# Patient Record
Sex: Male | Born: 1950 | Race: White | Hispanic: No | Marital: Single | Smoking: Former smoker
Health system: Southern US, Community
[De-identification: ages and names within clinical notes are randomized; demographics above are authoritative.]

---

## 2013-10-20 DIAGNOSIS — N401 Enlarged prostate with lower urinary tract symptoms: Secondary | ICD-10-CM

## 2013-10-20 DIAGNOSIS — G8929 Other chronic pain: Secondary | ICD-10-CM

## 2013-10-20 DIAGNOSIS — M545 Low back pain, unspecified: Secondary | ICD-10-CM

## 2013-10-20 HISTORY — DX: Low back pain, unspecified: M54.50

## 2013-10-20 HISTORY — DX: Other chronic pain: G89.29

## 2013-10-20 HISTORY — DX: Benign prostatic hyperplasia with lower urinary tract symptoms: N40.1

## 2014-01-24 DIAGNOSIS — K219 Gastro-esophageal reflux disease without esophagitis: Secondary | ICD-10-CM

## 2014-01-24 DIAGNOSIS — J449 Chronic obstructive pulmonary disease, unspecified: Secondary | ICD-10-CM

## 2014-01-24 DIAGNOSIS — I5022 Chronic systolic (congestive) heart failure: Secondary | ICD-10-CM

## 2014-01-24 DIAGNOSIS — I10 Essential (primary) hypertension: Secondary | ICD-10-CM | POA: Insufficient documentation

## 2014-01-24 HISTORY — DX: Chronic obstructive pulmonary disease, unspecified: J44.9

## 2014-01-24 HISTORY — DX: Gastro-esophageal reflux disease without esophagitis: K21.9

## 2014-01-24 HISTORY — DX: Chronic systolic (congestive) heart failure: I50.22

## 2014-01-24 HISTORY — DX: Essential (primary) hypertension: I10

## 2015-08-28 DIAGNOSIS — I2510 Atherosclerotic heart disease of native coronary artery without angina pectoris: Secondary | ICD-10-CM

## 2015-08-28 HISTORY — DX: Atherosclerotic heart disease of native coronary artery without angina pectoris: I25.10

## 2017-05-05 ENCOUNTER — Other Ambulatory Visit: Payer: Self-pay

## 2017-06-30 ENCOUNTER — Other Ambulatory Visit: Payer: Self-pay | Admitting: Cardiology

## 2017-10-14 DIAGNOSIS — I4729 Other ventricular tachycardia: Secondary | ICD-10-CM

## 2017-10-14 HISTORY — DX: Other ventricular tachycardia: I47.29

## 2018-01-28 DIAGNOSIS — Z95810 Presence of automatic (implantable) cardiac defibrillator: Secondary | ICD-10-CM

## 2018-01-28 HISTORY — DX: Presence of automatic (implantable) cardiac defibrillator: Z95.810

## 2020-09-18 DIAGNOSIS — Z79891 Long term (current) use of opiate analgesic: Secondary | ICD-10-CM

## 2020-09-18 HISTORY — DX: Long term (current) use of opiate analgesic: Z79.891

## 2022-03-29 ENCOUNTER — Emergency Department (HOSPITAL_BASED_OUTPATIENT_CLINIC_OR_DEPARTMENT_OTHER): Payer: Medicare HMO

## 2022-03-29 ENCOUNTER — Ambulatory Visit: Payer: Self-pay | Admitting: *Deleted

## 2022-03-29 ENCOUNTER — Inpatient Hospital Stay (HOSPITAL_BASED_OUTPATIENT_CLINIC_OR_DEPARTMENT_OTHER)
Admission: EM | Admit: 2022-03-29 | Discharge: 2022-04-03 | DRG: 641 | Disposition: A | Discharge status: Home Health | Payer: Medicare HMO | Attending: Internal Medicine | Admitting: Internal Medicine

## 2022-03-29 DIAGNOSIS — I110 Hypertensive heart disease with heart failure: Secondary | ICD-10-CM | POA: Diagnosis not present

## 2022-03-29 DIAGNOSIS — Z91148 Patient's other noncompliance with medication regimen for other reason: Secondary | ICD-10-CM

## 2022-03-29 DIAGNOSIS — Z91041 Radiographic dye allergy status: Secondary | ICD-10-CM

## 2022-03-29 DIAGNOSIS — R296 Repeated falls: Secondary | ICD-10-CM | POA: Diagnosis not present

## 2022-03-29 DIAGNOSIS — E860 Dehydration: Secondary | ICD-10-CM | POA: Diagnosis present

## 2022-03-29 DIAGNOSIS — Z87891 Personal history of nicotine dependence: Secondary | ICD-10-CM

## 2022-03-29 DIAGNOSIS — M545 Low back pain, unspecified: Secondary | ICD-10-CM | POA: Diagnosis not present

## 2022-03-29 DIAGNOSIS — Z882 Allergy status to sulfonamides status: Secondary | ICD-10-CM | POA: Diagnosis not present

## 2022-03-29 DIAGNOSIS — N400 Enlarged prostate without lower urinary tract symptoms: Secondary | ICD-10-CM | POA: Diagnosis present

## 2022-03-29 DIAGNOSIS — Z765 Malingerer [conscious simulation]: Secondary | ICD-10-CM | POA: Diagnosis not present

## 2022-03-29 DIAGNOSIS — J449 Chronic obstructive pulmonary disease, unspecified: Secondary | ICD-10-CM | POA: Diagnosis present

## 2022-03-29 DIAGNOSIS — Z91030 Bee allergy status: Secondary | ICD-10-CM | POA: Diagnosis not present

## 2022-03-29 DIAGNOSIS — Z7982 Long term (current) use of aspirin: Secondary | ICD-10-CM

## 2022-03-29 DIAGNOSIS — F1310 Sedative, hypnotic or anxiolytic abuse, uncomplicated: Secondary | ICD-10-CM | POA: Diagnosis present

## 2022-03-29 DIAGNOSIS — I2510 Atherosclerotic heart disease of native coronary artery without angina pectoris: Secondary | ICD-10-CM | POA: Diagnosis present

## 2022-03-29 DIAGNOSIS — Z9981 Dependence on supplemental oxygen: Secondary | ICD-10-CM

## 2022-03-29 DIAGNOSIS — G8929 Other chronic pain: Secondary | ICD-10-CM | POA: Diagnosis not present

## 2022-03-29 DIAGNOSIS — F1010 Alcohol abuse, uncomplicated: Secondary | ICD-10-CM | POA: Diagnosis not present

## 2022-03-29 DIAGNOSIS — Z79891 Long term (current) use of opiate analgesic: Secondary | ICD-10-CM | POA: Diagnosis not present

## 2022-03-29 DIAGNOSIS — Z79899 Other long term (current) drug therapy: Secondary | ICD-10-CM

## 2022-03-29 DIAGNOSIS — E876 Hypokalemia: Secondary | ICD-10-CM | POA: Diagnosis present

## 2022-03-29 DIAGNOSIS — K219 Gastro-esophageal reflux disease without esophagitis: Secondary | ICD-10-CM | POA: Diagnosis not present

## 2022-03-29 DIAGNOSIS — Z888 Allergy status to other drugs, medicaments and biological substances status: Secondary | ICD-10-CM

## 2022-03-29 DIAGNOSIS — F1110 Opioid abuse, uncomplicated: Secondary | ICD-10-CM | POA: Diagnosis present

## 2022-03-29 DIAGNOSIS — I472 Ventricular tachycardia, unspecified: Secondary | ICD-10-CM | POA: Diagnosis present

## 2022-03-29 DIAGNOSIS — Z95810 Presence of automatic (implantable) cardiac defibrillator: Secondary | ICD-10-CM | POA: Diagnosis not present

## 2022-03-29 DIAGNOSIS — Z7951 Long term (current) use of inhaled steroids: Secondary | ICD-10-CM

## 2022-03-29 DIAGNOSIS — R531 Weakness: Principal | ICD-10-CM

## 2022-03-29 DIAGNOSIS — I509 Heart failure, unspecified: Secondary | ICD-10-CM

## 2022-03-29 DIAGNOSIS — I5022 Chronic systolic (congestive) heart failure: Secondary | ICD-10-CM | POA: Diagnosis present

## 2022-03-29 DIAGNOSIS — I951 Orthostatic hypotension: Secondary | ICD-10-CM | POA: Diagnosis not present

## 2022-03-29 DIAGNOSIS — I959 Hypotension, unspecified: Secondary | ICD-10-CM

## 2022-03-29 DIAGNOSIS — J9611 Chronic respiratory failure with hypoxia: Secondary | ICD-10-CM | POA: Diagnosis not present

## 2022-03-29 LAB — URINALYSIS, ROUTINE W REFLEX MICROSCOPIC
Bilirubin Urine: NEGATIVE
Glucose, UA: NEGATIVE mg/dL
Ketones, ur: NEGATIVE mg/dL
Leukocytes,Ua: NEGATIVE
Nitrite: NEGATIVE
Protein, ur: NEGATIVE mg/dL
Specific Gravity, Urine: 1.01 (ref 1.005–1.030)
pH: 6 (ref 5.0–8.0)

## 2022-03-29 LAB — COMPREHENSIVE METABOLIC PANEL
ALT: 25 U/L (ref 0–44)
AST: 26 U/L (ref 15–41)
Albumin: 3.3 g/dL — ABNORMAL LOW (ref 3.5–5.0)
Alkaline Phosphatase: 75 U/L (ref 38–126)
Anion gap: 8 (ref 5–15)
BUN: 16 mg/dL (ref 8–23)
CO2: 27 mmol/L (ref 22–32)
Calcium: 8.5 mg/dL — ABNORMAL LOW (ref 8.9–10.3)
Chloride: 104 mmol/L (ref 98–111)
Creatinine, Ser: 1.49 mg/dL — ABNORMAL HIGH (ref 0.61–1.24)
GFR, Estimated: 50 mL/min — ABNORMAL LOW (ref >60)
Glucose, Bld: 122 mg/dL — ABNORMAL HIGH (ref 70–99)
Potassium: 3 mmol/L — ABNORMAL LOW (ref 3.5–5.1)
Sodium: 139 mmol/L (ref 135–145)
Total Bilirubin: 0.8 mg/dL (ref 0.3–1.2)
Total Protein: 6.2 g/dL — ABNORMAL LOW (ref 6.5–8.1)

## 2022-03-29 LAB — CBC
HCT: 46.4 % (ref 39.0–52.0)
Hemoglobin: 14.8 g/dL (ref 13.0–17.0)
MCH: 27.6 pg (ref 26.0–34.0)
MCHC: 31.9 g/dL (ref 30.0–36.0)
MCV: 86.4 fL (ref 80.0–100.0)
Platelets: 149 10*3/uL — ABNORMAL LOW (ref 150–400)
RBC: 5.37 MIL/uL (ref 4.22–5.81)
RDW: 20 % — ABNORMAL HIGH (ref 11.5–15.5)
WBC: 6.2 10*3/uL (ref 4.0–10.5)
nRBC: 0.5 % — ABNORMAL HIGH (ref 0.0–0.2)

## 2022-03-29 LAB — DIFFERENTIAL
Abs Immature Granulocytes: 0.03 10*3/uL (ref 0.00–0.07)
Basophils Absolute: 0 10*3/uL (ref 0.0–0.1)
Basophils Relative: 0 %
Eosinophils Absolute: 0.1 10*3/uL (ref 0.0–0.5)
Eosinophils Relative: 1 %
Immature Granulocytes: 1 %
Lymphocytes Relative: 16 %
Lymphs Abs: 1 10*3/uL (ref 0.7–4.0)
Monocytes Absolute: 0.7 10*3/uL (ref 0.1–1.0)
Monocytes Relative: 11 %
Neutro Abs: 4.4 10*3/uL (ref 1.7–7.7)
Neutrophils Relative %: 71 %

## 2022-03-29 LAB — BRAIN NATRIURETIC PEPTIDE: B Natriuretic Peptide: 200.9 pg/mL — ABNORMAL HIGH (ref 0.0–100.0)

## 2022-03-29 LAB — I-STAT VENOUS BLOOD GAS, ED
Acid-Base Excess: 3 mmol/L — ABNORMAL HIGH (ref 0.0–2.0)
Bicarbonate: 28.2 mmol/L — ABNORMAL HIGH (ref 20.0–28.0)
Calcium, Ion: 1.12 mmol/L — ABNORMAL LOW (ref 1.15–1.40)
HCT: 41 % (ref 39.0–52.0)
Hemoglobin: 13.9 g/dL (ref 13.0–17.0)
O2 Saturation: 86 %
Patient temperature: 98.8
Potassium: 2.9 mmol/L — ABNORMAL LOW (ref 3.5–5.1)
Sodium: 140 mmol/L (ref 135–145)
TCO2: 30 mmol/L (ref 22–32)
pCO2, Ven: 43.5 mmHg — ABNORMAL LOW (ref 44–60)
pH, Ven: 7.421 (ref 7.25–7.43)
pO2, Ven: 52 mmHg — ABNORMAL HIGH (ref 32–45)

## 2022-03-29 LAB — TROPONIN I (HIGH SENSITIVITY): Troponin I (High Sensitivity): 12 ng/L (ref <18)

## 2022-03-29 LAB — URINALYSIS, MICROSCOPIC (REFLEX): WBC, UA: NONE SEEN WBC/hpf (ref 0–5)

## 2022-03-29 LAB — PROTIME-INR
INR: 1.1 (ref 0.8–1.2)
Prothrombin Time: 14.1 seconds (ref 11.4–15.2)

## 2022-03-29 LAB — AMMONIA: Ammonia: 34 umol/L (ref 9–35)

## 2022-03-29 LAB — CBG MONITORING, ED: Glucose-Capillary: 149 mg/dL — ABNORMAL HIGH (ref 70–99)

## 2022-03-29 LAB — APTT: aPTT: 28 seconds (ref 24–36)

## 2022-03-29 LAB — ETHANOL: Alcohol, Ethyl (B): <10 mg/dL (ref <10)

## 2022-03-29 MED ORDER — LACTATED RINGERS IV BOLUS
500.0000 mL | Freq: Once | INTRAVENOUS | Status: AC
  Administered 2022-03-29: 500 mL via INTRAVENOUS

## 2022-03-29 NOTE — Telephone Encounter (Signed)
  Chief Complaint: Falls Symptoms:  Frequency:  Pertinent Negatives: Patient denies  Disposition: [x] ED /[] Urgent Care (no appt availability in office) / [] Appointment(In office/virtual)/ []  [facility]/ [] Home Care/ [] Refused Recommended Disposition /[] [facility]/ []  Follow-up with PCP Additional Notes: Triage incomplete as pt falling again during call as he attempted to get up. Advised EMS. Sister states will do so. Reason for Disposition  [1] SEVERE weakness (i.e., unable to walk or barely able to walk, requires support) AND [2] new-onset or worsening  Answer Assessment - Initial Assessment Questions 1. MECHANISM: "How did the fall happen?"     Weakness 2. DOMESTIC VIOLENCE AND ELDER ABUSE SCREENING: "Did you fall because someone pushed you or tried to hurt you?" If Yes, ask: "Are you safe now?"     *No Answer* 3. ONSET: "When did the fall happen?" (e.g., minutes, hours, or days ago)     *No Answer* 4. LOCATION: "What part of the body hit the ground?" (e.g., back, buttocks, head, hips, knees, hands, head, stomach)     *No Answer* 5. INJURY: "Did you hurt (injure) yourself when you fell?" If Yes, ask: "What did you injure? Tell me more about this?" (e.g., body area; type of injury; pain severity)"     *No Answer* 6. PAIN: "Is there any pain?" If Yes, ask: "How bad is the pain?" (e.g., Scale 1-10; or mild,  moderate, severe)   - NONE (0): No pain   - MILD (1-3): Doesn't interfere with normal activities    - MODERATE (4-7): Interferes with normal activities or awakens from sleep    - SEVERE (8-10): Excruciating pain, unable to do any normal activities      *No Answer* 7. SIZE: For cuts, bruises, or swelling, ask: "How large is it?" (e.g., inches or centimeters)      *No Answer* 8. PREGNANCY: "Is there any chance you are pregnant?" "When was your last menstrual period?"     *No Answer* 9. OTHER SYMPTOMS: "Do you have any other symptoms?" (e.g., dizziness, fever,  weakness; new onset or worsening).      *No Answer* 10. CAUSE: "What do you think caused the fall (or falling)?" (e.g., tripped, dizzy spell)       *No Answer*  Protocols used: Falls and [facility]

## 2022-03-29 NOTE — ED Triage Notes (Signed)
Pt has been falling more frequently. Sister states she spoke with him on the phone 3 days ago and noticed his speech was slurred. Sister states he has had an unsteady gait, fell 3 times last night.   Larey Seat and hit his head on the end of the bed 3 nights ago.

## 2022-03-29 NOTE — ED Provider Notes (Signed)
[facility] Provider Note   CSN: [id number] Arrival date & time: 03/29/22  1845     History  Chief Complaint  Patient presents with   Aphasia   Fall    Ryan Rodgers is a 71 y.o. male.  Patient is a 71 year old male with a significant past medical history for CAD, CHF with most recent echo showing an EF of 20 to 25%, recent hospitalizations for pneumonia and sepsis a little over a month ago who is presenting today with his sister due to generalized weakness, recurrent falling and slurred speech.  Patient sister reports that he was staying with her after his last hospitalization but has been home for the last 4 weeks.  He has been doing pretty well getting around with his walker but in the last week he has suddenly started to have generalized weakness.  He initially was telling her he was fine however his neighbor heard him fall and he then admitted in the last few days he has had multiple falls because he is so weak he cannot even stand.  He has hit his head a few times during the falls but denies loss of consciousness.  He denies any chest pain or shortness of breath at this time.  When looking through cardiology notes it reports that patient's weight was up however when talking with the patient and his sister they report he is down 6 pounds.  Patient denies any nausea vomiting or diarrhea.  He denies any abdominal pain.  He reports he generally just feels unwell.  He does report he has had some new medications but is not sure what they are.  He has tried to be compliant with the medications.  However when looking at cardiology notes he was taking spironolactone as he was just continued on his Entresto due to fluid down and syncope.  He had also been on Lasix but reports that he was taking some Aldactone.  He does report urinating but generally feels weak.  Sister reports when she talk to him on Tuesday he seemed to have some slurred speech.  Is unclear how frequently  patient is taking Xanax he was given for anxiety by his PCP or when he last took it.  However his sister reports today when they got there he was so weak he could not get up.  It took her husband and a neighbor to pick him up and get him in the car but he was unable to walk independently.  The history is provided by the patient, a relative and medical records.  Fall       Home Medications Prior to Admission medications   Medication Sig Start Date End Date Taking? Authorizing Provider  aspirin EC 81 MG tablet Take by mouth. 06/07/20  Yes [provider]  ALPRAZolam Duanne Moron) 1 MG tablet Take 1 mg by mouth 2 (two) times daily as needed. 03/23/22   [provider]  BREO ELLIPTA 100-25 MCG/ACT AEPB Inhale 1 puff into the lungs daily. 03/20/22   [provider]  cefdinir (OMNICEF) 300 MG capsule Take 300 mg by mouth 2 (two) times daily. 02/08/22   [provider]  diclofenac Sodium (VOLTAREN) 1 % GEL Apply topically. 03/19/22   [provider]  ENTRESTO 24-26 MG Take 1 tablet by mouth 2 (two) times daily. 02/27/22   [provider]  finasteride (PROSCAR) 5 MG tablet Take 5 mg by mouth daily. 01/08/22   [provider]  HYDROcodone-acetaminophen (NORCO/VICODIN) 5-325  MG tablet Take 1 tablet by mouth 3 (three) times daily as needed. 03/20/22   [provider]  metoprolol succinate (TOPROL-XL) 25 MG 24 hr tablet Take 25 mg by mouth 2 (two) times daily. 01/08/22   [provider]  mexiletine (MEXITIL) 150 MG capsule Take by mouth. 03/05/22   [provider]  omeprazole (PRILOSEC) 40 MG capsule Take 40 mg by mouth daily. 03/05/22   [provider]  ondansetron (ZOFRAN-ODT) 4 MG disintegrating tablet Take by mouth. 02/08/22   [provider]  potassium chloride SA (KLOR-CON M) 20 MEQ tablet Take 20 mEq by mouth 2 (two) times daily. 01/08/22   [provider]  predniSONE (DELTASONE) 10 MG tablet Take by  mouth. 02/08/22   [provider]  rosuvastatin (CRESTOR) 20 MG tablet Take 20 mg by mouth at bedtime. 01/08/22   [provider]  spironolactone (ALDACTONE) 25 MG tablet Take 12.5 mg by mouth daily. 03/25/22   [provider]  tamsulosin (FLOMAX) 0.4 MG CAPS capsule Take 0.8 mg by mouth daily. 01/08/22   [provider]  venlafaxine XR (EFFEXOR-XR) 150 MG 24 hr capsule Take 150 mg by mouth daily. 01/09/22   [provider]      Allergies    Bee venom, Buspirone, Iodinated contrast media, and Sulfa antibiotics    Review of Systems   Review of Systems  Physical Exam Updated Vital Signs BP (!) 92/52   Pulse 88   Temp 98.4 F (36.9 C) (Oral)   Resp (!) 21   Ht 5\' 8"  (1.727 m)   Wt 75.4 kg   SpO2 93%   BMI 25.27 kg/m  Physical Exam Vitals and nursing note reviewed.  Constitutional:      General: He is not in acute distress.    Appearance: He is well-developed.     Comments: Patient is sleepy but is able to answer questions appropriately.  HENT:     Head: Normocephalic and atraumatic.  Eyes:     Conjunctiva/sclera: Conjunctivae normal.     Pupils: Pupils are equal, round, and reactive to light.  Cardiovascular:     Rate and Rhythm: Normal rate and regular rhythm.     Heart sounds: No murmur heard. Pulmonary:     Effort: Pulmonary effort is normal. No respiratory distress.     Breath sounds: Normal breath sounds. No wheezing or rales.  Abdominal:     General: There is no distension.     Palpations: Abdomen is soft.     Tenderness: There is no abdominal tenderness. There is no guarding or rebound.  Musculoskeletal:        General: No tenderness. Normal range of motion.     Cervical back: Normal range of motion and neck supple.     Right lower leg: No edema.     Left lower leg: No edema.  Skin:    General: Skin is warm and dry.     Findings: No erythema or rash.  Neurological:     Comments: Oriented to person and place.  Speech is  slowed but no significant slurred speech.  No notable facial droop.  Strength is intact in all 4 extremities without pronator drift but 4 out of 5 throughout.  Sensation is intact.  No nystagmus and extraocular movements are normal.  Pupils are reactive bilaterally.  Psychiatric:        Behavior: Behavior normal.     ED Results / Procedures / Treatments   Labs (all labs  ordered are listed, but only abnormal results are displayed) Labs Reviewed  CBC - Abnormal; Notable for the following components:      Result Value   RDW 20.0 (*)    Platelets 149 (*)    nRBC 0.5 (*)    All other components within normal limits  COMPREHENSIVE METABOLIC PANEL - Abnormal; Notable for the following components:   Potassium 3.0 (*)    Glucose, Bld 122 (*)    Creatinine, Ser 1.49 (*)    Calcium 8.5 (*)    Total Protein 6.2 (*)    Albumin 3.3 (*)    GFR, Estimated 50 (*)    All other components within normal limits  URINALYSIS, ROUTINE W REFLEX MICROSCOPIC - Abnormal; Notable for the following components:   Hgb urine dipstick TRACE (*)    All other components within normal limits  BRAIN NATRIURETIC PEPTIDE - Abnormal; Notable for the following components:   B Natriuretic Peptide 200.9 (*)    All other components within normal limits  URINALYSIS, MICROSCOPIC (REFLEX) - Abnormal; Notable for the following components:   Bacteria, UA RARE (*)    All other components within normal limits  CBG MONITORING, ED - Abnormal; Notable for the following components:   Glucose-Capillary 149 (*)    All other components within normal limits  I-STAT VENOUS BLOOD GAS, ED - Abnormal; Notable for the following components:   pCO2, Ven 43.5 (*)    pO2, Ven 52 (*)    Bicarbonate 28.2 (*)    Acid-Base Excess 3.0 (*)    Potassium 2.9 (*)    Calcium, Ion 1.12 (*)    All other components within normal limits  PROTIME-INR  APTT  DIFFERENTIAL  AMMONIA  ETHANOL  CBG MONITORING, ED  TROPONIN I (HIGH SENSITIVITY)     EKG EKG Interpretation  Date/Time:  Friday March 29 2022 19:13:49 EDT Ventricular Rate:  75 PR Interval:  215 QRS Duration: 177 QT Interval:  447 QTC Calculation: 500 R Axis:   154 Text Interpretation: Sinus or ectopic atrial rhythm Borderline prolonged PR interval Right bundle branch block No previous tracing Confirmed by Blanchie Dessert [phone]) on 03/29/2022 7:38:10 PM  Radiology CT Chest Wo Contrast  Result Date: 03/29/2022 CLINICAL DATA:  Shortness of breath. EXAM: CT CHEST WITHOUT CONTRAST TECHNIQUE: Multidetector CT imaging of the chest was performed following the standard protocol without IV contrast. RADIATION DOSE REDUCTION: This exam was performed according to the departmental dose-optimization program which includes automated exposure control, adjustment of the mA and/or kV according to patient size and/or use of iterative reconstruction technique. COMPARISON:  CT dated 12/17/2012 and chest radiograph dated 03/29/2022. FINDINGS: Evaluation of this exam is limited in the absence of intravenous contrast. Cardiovascular: There is no cardiomegaly or pericardial effusion. Advanced 3 vessel coronary vascular calcification. Mild atherosclerotic calcification of the thoracic aorta. No aneurysmal dilatation. The central pulmonary arteries are grossly unremarkable. Left sided pacemaker device. Mediastinum/Nodes: No hilar or mediastinal adenopathy. The esophagus and thyroid gland are grossly unremarkable. No mediastinal fluid collection. Lungs/Pleura: Severe centrilobular emphysema. Bilateral lower lobe linear scarring as well as areas of scarring in the left upper lobe. The left upper lobe scarring as nodular components measuring up to 13 mm. No consolidative changes. There is no pleural effusion or pneumothorax. The central airways are patent. Upper Abdomen: Multiple gallstones.  A 3.5 cm left renal pole cyst. Musculoskeletal: No acute osseous pathology. IMPRESSION: 1. No acute intrathoracic  pathology. 2. Left upper lobe nodular scarring. Non-contrast chest CT at  3-6 months is recommended. If the nodules are stable at time of repeat CT, then future CT at 18-24 months (from today's scan) is considered optional for low-risk patients, but is recommended for high-risk patients. This recommendation follows the consensus statement: Guidelines for Management of Incidental Pulmonary Nodules Detected on CT Images: From the Fleischner Society 2017; Radiology 2017; 284:228-243. 3. Cholelithiasis. 4. Aortic Atherosclerosis (ICD10-I70.0) and Emphysema (ICD10-J43.9). Electronically Signed   By: Anner Crete M.D.   On: 03/29/2022 22:56   DG Chest Port 1 View  Result Date: 03/29/2022 CLINICAL DATA:  Weakness, fall 3 days ago. EXAM: PORTABLE CHEST 1 VIEW COMPARISON:  02/07/2022. FINDINGS: Heart is enlarged the mediastinal contour is stable. The pulmonary vasculature is within normal limits. Atherosclerotic calcification of the aorta is noted. A multi lead pacemaker device is present over the left chest. Lung volumes are low with mild atelectasis or infiltrate at the lung bases. There are small bilateral pleural effusions. No pneumothorax. No acute osseous abnormality. IMPRESSION: 1. Small pleural effusions with atelectasis at the lung bases. 2. Cardiomegaly. Electronically Signed   By: Brett Fairy M.D.   On: 03/29/2022 20:08   CT HEAD WO CONTRAST  Result Date: 03/29/2022 CLINICAL DATA:  Head trauma, abnormal mental status (Age 97-64y) EXAM: CT HEAD WITHOUT CONTRAST TECHNIQUE: Contiguous axial images were obtained from the base of the skull through the vertex without intravenous contrast. RADIATION DOSE REDUCTION: This exam was performed according to the departmental dose-optimization program which includes automated exposure control, adjustment of the mA and/or kV according to patient size and/or use of iterative reconstruction technique. COMPARISON:  CT head 02/22/2022 BRAIN: BRAIN Trace patchy areas of  decreased attenuation are noted throughout the deep and periventricular white matter of the cerebral hemispheres bilaterally, compatible with chronic microvascular ischemic disease. No evidence of large-territorial acute infarction. No parenchymal hemorrhage. No mass lesion. No extra-axial collection. No mass effect or midline shift. No hydrocephalus. Basilar cisterns are patent. Vascular: No hyperdense vessel. Atherosclerotic calcifications are present within the cavernous internal carotid arteries. Skull: No acute fracture or focal lesion. Sinuses/Orbits: Paranasal sinuses and mastoid air cells are clear. The orbits are unremarkable. Other: None. IMPRESSION: No acute intracranial abnormality. Electronically Signed   By: Iven Finn M.D.   On: 03/29/2022 19:48    Procedures Procedures    Medications Ordered in ED Medications  lactated ringers bolus 500 mL (0 mLs Intravenous Stopped 03/29/22 2134)    ED Course/ Medical Decision Making/ A&P                           Medical Decision Making Amount and/or Complexity of Data Reviewed External Data Reviewed: notes.    Details: Hospital discharge records Labs: ordered. Decision-making details documented in ED Course. Radiology: ordered and independent interpretation performed. Decision-making details documented in ED Course. ECG/medicine tests: ordered and independent interpretation performed. Decision-making details documented in ED Course.  Risk Decision regarding hospitalization.   Pt with multiple medical problems and comorbidities and presenting today with a complaint that caries a high risk for morbidity and mortality.  Presenting today with multiple symptoms of weakness, slurred speech, inability to walk and recurrent falls.  Patient does not take anticoagulation.  Concern for head bleed due to trauma versus acute cardiac decompensation given patient's history of an EF of 20 to 25% and prior CHF exacerbation.  Patient reports he is down  weight however with our weight here he is up a few pounds as he  is 165 here and his weight is supposed to be 161.  Patient does not look excessively fluid overloaded.  There is no swelling in his ankles or abdomen.  However he was noted to be hypoxic and hypotensive on exam when based on prior documentation blood pressure is usually normal.  Unclear exactly how patient is taking his diuretics were how well he has been eating as he lives alone and his sister has not seen him in a few days but has only talk to them on the phone.  Also concern for polypharmacy as patient was given Xanax by PCP and unclear if that is causing some of his symptoms versus a new infection.  He was normothermic here with normal heart rate.  He did require oxygen due to hypoxia in the 80s.  Blood pressure has been in the upper 90s but occasionally will dip into the 80s. Patient's echo results from [facility] are documented below. The left ventricle is borderline dilated. There is normal left  ventricular wall thickness. There is akinesis of the 'large area of  anteroseptal, anterior and apical' left ventricular segments,  consistent with prior infarct, and hypokinesis of the remaining left  ventricular segments, consistent with left ventricular [ischemic]  remodelling.  Left ventricular systolic function is severely reduced. LV ejection  fraction = 20-25%.  The right ventricular systolic function is moderate to severely  reduced.  The atria are normal in size.  There is mild tricuspid regurgitation.  There was insufficient TR detected to calculate RV systolic pressure.    11:14 PM I independently interpreted patient's EKG and labs.  EKG shows a right bundle branch block without old to compare.  VBG with normal pH and a CO2 of 43 without evidence of hypercarbia, PT/INR within normal limits, CBC within normal limits, CMP with hypokalemia with potassium of 3.0, creatinine of 1.49 is up slightly from his typical her creatinine  which is 1.1 and troponin is 12.  With this information ammonia, EtOH, UA was added. I have independently visualized and interpreted pt's images today. Head CT without evidence of acute bleed and chest x-ray without evidence of fluid overload but cardiomegaly.  Concern that patient is fluid depleted based on the hypotension and exam findings.  We will give gentle bolus of 500 mL and reevaluate.  11:14 PM Patient's ammonia, EtOH levels are normal.  UA within normal limits.  Patient's blood pressure has improved after IV fluids however patient remains hypoxic here.  He is requiring 2 L to maintain oxygen saturation in the 90s.  If the 2 L are removed he desats to the 80s.  CT to further evaluate for causes of hypoxia is pending.  Patient after IV fluid boluses more awake and alert now requesting something to eat.  However given the new oxygen requirement feel that he will need admission for further care.  Low suspicion is for stroke at this time as patient's symptoms appear more global.  11:14 PM When speaking over the results with the patient and his sister now he is much more awake and alert.  He does report that he takes hydrocodone and Xanax at home which also may be attributing to some of his symptoms but blood pressure is improved after fluids and he does admit he has not been eating or drinking much recently.  Patient did go home from the hospital with oxygen but had been refusing to use it and when he went back to his own home his sister sent it back  and they have no oxygen.  Unable to send him home with oxygen tonight and given his generalized weakness and recurrent falls feel that he needs admission for hypoxia to make sure he can still ambulate and that he can get oxygen at home.         Final Clinical Impression(s) / ED Diagnoses Final diagnoses:  Weakness  Recurrent falls  Hypotension, unspecified hypotension type  Chronic heart failure, unspecified heart failure type [facility])    Rx  / DC Orders ED Discharge Orders     None         Blanchie Dessert, MD 03/29/22 2314

## 2022-03-29 NOTE — ED Notes (Signed)
SPO2 upper 80's, placed on 4lpm [place], SpO2 93%

## 2022-03-29 NOTE — ED Notes (Signed)
Pt unable to move himself to bed. Sister states has been getting more confused.   SpO2 89% on RA at arrival. Hypotensive, Dr. Anitra Lauth notified of patients symptoms.

## 2022-03-30 ENCOUNTER — Other Ambulatory Visit: Payer: Self-pay

## 2022-03-30 ENCOUNTER — Encounter (HOSPITAL_COMMUNITY): Payer: Self-pay | Admitting: Internal Medicine

## 2022-03-30 DIAGNOSIS — J449 Chronic obstructive pulmonary disease, unspecified: Secondary | ICD-10-CM | POA: Diagnosis not present

## 2022-03-30 DIAGNOSIS — E860 Dehydration: Secondary | ICD-10-CM

## 2022-03-30 DIAGNOSIS — Z95810 Presence of automatic (implantable) cardiac defibrillator: Secondary | ICD-10-CM

## 2022-03-30 DIAGNOSIS — I509 Heart failure, unspecified: Secondary | ICD-10-CM

## 2022-03-30 DIAGNOSIS — J9611 Chronic respiratory failure with hypoxia: Secondary | ICD-10-CM | POA: Diagnosis not present

## 2022-03-30 DIAGNOSIS — I951 Orthostatic hypotension: Secondary | ICD-10-CM

## 2022-03-30 DIAGNOSIS — I5022 Chronic systolic (congestive) heart failure: Secondary | ICD-10-CM

## 2022-03-30 DIAGNOSIS — E876 Hypokalemia: Secondary | ICD-10-CM

## 2022-03-30 HISTORY — DX: Orthostatic hypotension: I95.1

## 2022-03-30 LAB — MAGNESIUM: Magnesium: 1.8 mg/dL (ref 1.7–2.4)

## 2022-03-30 LAB — CREATININE, SERUM
Creatinine, Ser: 1.04 mg/dL (ref 0.61–1.24)
GFR, Estimated: >60 mL/min (ref >60)

## 2022-03-30 LAB — CBC
HCT: 44.9 % (ref 39.0–52.0)
Hemoglobin: 14.3 g/dL (ref 13.0–17.0)
MCH: 28.2 pg (ref 26.0–34.0)
MCHC: 31.8 g/dL (ref 30.0–36.0)
MCV: 88.6 fL (ref 80.0–100.0)
Platelets: 137 10*3/uL — ABNORMAL LOW (ref 150–400)
RBC: 5.07 MIL/uL (ref 4.22–5.81)
RDW: 19.9 % — ABNORMAL HIGH (ref 11.5–15.5)
WBC: 5.4 10*3/uL (ref 4.0–10.5)
nRBC: 0 % (ref 0.0–0.2)

## 2022-03-30 LAB — COMPREHENSIVE METABOLIC PANEL
ALT: 24 U/L (ref 0–44)
AST: 23 U/L (ref 15–41)
Albumin: 3 g/dL — ABNORMAL LOW (ref 3.5–5.0)
Alkaline Phosphatase: 61 U/L (ref 38–126)
Anion gap: 7 (ref 5–15)
BUN: 14 mg/dL (ref 8–23)
CO2: 29 mmol/L (ref 22–32)
Calcium: 8.1 mg/dL — ABNORMAL LOW (ref 8.9–10.3)
Chloride: 106 mmol/L (ref 98–111)
Creatinine, Ser: 1.13 mg/dL (ref 0.61–1.24)
GFR, Estimated: >60 mL/min (ref >60)
Glucose, Bld: 95 mg/dL (ref 70–99)
Potassium: 3 mmol/L — ABNORMAL LOW (ref 3.5–5.1)
Sodium: 142 mmol/L (ref 135–145)
Total Bilirubin: 1 mg/dL (ref 0.3–1.2)
Total Protein: 5.7 g/dL — ABNORMAL LOW (ref 6.5–8.1)

## 2022-03-30 LAB — ETHANOL: Alcohol, Ethyl (B): <10 mg/dL (ref <10)

## 2022-03-30 LAB — PHOSPHORUS: Phosphorus: 3.7 mg/dL (ref 2.5–4.6)

## 2022-03-30 MED ORDER — MEXILETINE HCL 150 MG PO CAPS
150.0000 mg | ORAL_CAPSULE | Freq: Two times a day (BID) | ORAL | Status: DC
Start: 2022-03-30 — End: 2022-03-30

## 2022-03-30 MED ORDER — ONDANSETRON HCL 4 MG/2ML IJ SOLN
4.0000 mg | Freq: Four times a day (QID) | INTRAMUSCULAR | Status: DC | PRN
  Administered 2022-03-31: 4 mg via INTRAVENOUS
  Filled 2022-03-30: qty 2

## 2022-03-30 MED ORDER — ONDANSETRON 4 MG PO TBDP: 4.0000 mg | ORAL_TABLET | Freq: Three times a day (TID) | ORAL | Status: DC | PRN

## 2022-03-30 MED ORDER — ROSUVASTATIN CALCIUM 20 MG PO TABS
20.0000 mg | ORAL_TABLET | Freq: Every day | ORAL | Status: DC
  Administered 2022-03-30 – 2022-04-02 (×4): 20 mg via ORAL
  Filled 2022-03-30 (×4): qty 1

## 2022-03-30 MED ORDER — TRAMADOL HCL 50 MG PO TABS
50.0000 mg | ORAL_TABLET | Freq: Four times a day (QID) | ORAL | Status: DC
Start: 2022-03-30 — End: 2022-03-30

## 2022-03-30 MED ORDER — SPIRONOLACTONE 12.5 MG HALF TABLET: 12.5000 mg | ORAL_TABLET | Freq: Every day | ORAL | Status: DC

## 2022-03-30 MED ORDER — ASPIRIN 81 MG PO TBEC
81.0000 mg | DELAYED_RELEASE_TABLET | Freq: Every day | ORAL | Status: DC
  Administered 2022-03-30 – 2022-04-03 (×5): 81 mg via ORAL
  Filled 2022-03-30 (×5): qty 1

## 2022-03-30 MED ORDER — ACETAMINOPHEN 650 MG RE SUPP: 650.0000 mg | Freq: Four times a day (QID) | RECTAL | Status: DC | PRN

## 2022-03-30 MED ORDER — SPIRONOLACTONE 12.5 MG HALF TABLET
12.5000 mg | ORAL_TABLET | Freq: Every day | ORAL | Status: DC
  Administered 2022-03-30: 12.5 mg via ORAL
  Filled 2022-03-30: qty 1

## 2022-03-30 MED ORDER — ONDANSETRON HCL 4 MG PO TABS: 4.0000 mg | ORAL_TABLET | Freq: Four times a day (QID) | ORAL | Status: DC | PRN

## 2022-03-30 MED ORDER — POTASSIUM CHLORIDE CRYS ER 20 MEQ PO TBCR
40.0000 meq | EXTENDED_RELEASE_TABLET | Freq: Two times a day (BID) | ORAL | Status: AC
Start: 2022-03-30 — End: 2022-03-30
  Administered 2022-03-30 (×2): 40 meq via ORAL
  Filled 2022-03-30 (×2): qty 2

## 2022-03-30 MED ORDER — TAMSULOSIN HCL 0.4 MG PO CAPS
0.8000 mg | ORAL_CAPSULE | Freq: Every day | ORAL | Status: DC
  Administered 2022-03-30 – 2022-04-03 (×5): 0.8 mg via ORAL
  Filled 2022-03-30 (×5): qty 2

## 2022-03-30 MED ORDER — PANTOPRAZOLE SODIUM 40 MG PO TBEC
80.0000 mg | DELAYED_RELEASE_TABLET | Freq: Every day | ORAL | Status: DC
  Administered 2022-03-30 – 2022-04-03 (×5): 80 mg via ORAL
  Filled 2022-03-30 (×5): qty 2

## 2022-03-30 MED ORDER — VENLAFAXINE HCL ER 150 MG PO CP24
150.0000 mg | ORAL_CAPSULE | Freq: Every day | ORAL | Status: DC
  Administered 2022-03-31 – 2022-04-03 (×4): 150 mg via ORAL
  Filled 2022-03-30 (×4): qty 1

## 2022-03-30 MED ORDER — AMIODARONE HCL 200 MG PO TABS
200.0000 mg | ORAL_TABLET | Freq: Two times a day (BID) | ORAL | Status: DC
  Administered 2022-03-30 – 2022-04-03 (×9): 200 mg via ORAL
  Filled 2022-03-30 (×9): qty 1

## 2022-03-30 MED ORDER — ACETAMINOPHEN 325 MG PO TABS
650.0000 mg | ORAL_TABLET | Freq: Four times a day (QID) | ORAL | Status: DC | PRN
  Administered 2022-03-31 – 2022-04-03 (×5): 650 mg via ORAL
  Filled 2022-03-30 (×5): qty 2

## 2022-03-30 MED ORDER — METOPROLOL SUCCINATE ER 25 MG PO TB24: 25.0000 mg | ORAL_TABLET | Freq: Two times a day (BID) | ORAL | Status: DC

## 2022-03-30 MED ORDER — ALPRAZOLAM 0.5 MG PO TABS
0.5000 mg | ORAL_TABLET | Freq: Two times a day (BID) | ORAL | Status: DC | PRN
  Administered 2022-03-30 – 2022-04-02 (×5): 0.5 mg via ORAL
  Filled 2022-03-30 (×5): qty 1

## 2022-03-30 MED ORDER — HEPARIN SODIUM (PORCINE) 5000 UNIT/ML IJ SOLN
5000.0000 [IU] | Freq: Three times a day (TID) | INTRAMUSCULAR | Status: DC
  Administered 2022-03-30 – 2022-04-03 (×13): 5000 [IU] via SUBCUTANEOUS
  Filled 2022-03-30 (×13): qty 1

## 2022-03-30 MED ORDER — MEXILETINE HCL 150 MG PO CAPS
150.0000 mg | ORAL_CAPSULE | Freq: Two times a day (BID) | ORAL | Status: DC
  Administered 2022-03-30 – 2022-04-03 (×9): 150 mg via ORAL
  Filled 2022-03-30 (×9): qty 1

## 2022-03-30 MED ORDER — MIDODRINE HCL 5 MG PO TABS
2.5000 mg | ORAL_TABLET | Freq: Three times a day (TID) | ORAL | Status: DC
  Administered 2022-03-30 – 2022-03-31 (×4): 2.5 mg via ORAL
  Filled 2022-03-30 (×4): qty 1

## 2022-03-30 MED ORDER — DICLOFENAC SODIUM 1 % EX GEL
2.0000 g | Freq: Every day | CUTANEOUS | Status: DC | PRN
  Filled 2022-03-30: qty 100

## 2022-03-30 MED ORDER — FLUTICASONE FUROATE-VILANTEROL 100-25 MCG/ACT IN AEPB
1.0000 | INHALATION_SPRAY | Freq: Every day | RESPIRATORY_TRACT | Status: DC
  Administered 2022-03-31 – 2022-04-03 (×4): 1 via RESPIRATORY_TRACT
  Filled 2022-03-30: qty 28

## 2022-03-30 MED ORDER — TRAMADOL HCL 50 MG PO TABS
50.0000 mg | ORAL_TABLET | Freq: Four times a day (QID) | ORAL | Status: DC | PRN
  Administered 2022-03-31 – 2022-04-02 (×4): 50 mg via ORAL
  Filled 2022-03-30 (×4): qty 1

## 2022-03-30 MED ORDER — SACUBITRIL-VALSARTAN 24-26 MG PO TABS
1.0000 | ORAL_TABLET | Freq: Two times a day (BID) | ORAL | Status: DC
  Filled 2022-03-30: qty 1

## 2022-03-30 MED ORDER — FINASTERIDE 5 MG PO TABS
5.0000 mg | ORAL_TABLET | Freq: Every day | ORAL | Status: DC
  Administered 2022-03-30 – 2022-04-03 (×5): 5 mg via ORAL
  Filled 2022-03-30 (×5): qty 1

## 2022-03-30 MED ORDER — ALPRAZOLAM 0.5 MG PO TABS
0.5000 mg | ORAL_TABLET | Freq: Two times a day (BID) | ORAL | Status: DC
Start: 2022-03-30 — End: 2022-03-30

## 2022-03-30 MED ORDER — ALBUTEROL SULFATE (2.5 MG/3ML) 0.083% IN NEBU: 2.5000 mg | INHALATION_SOLUTION | RESPIRATORY_TRACT | Status: DC | PRN

## 2022-03-30 NOTE — H&P (Addendum)
History and Physical    Ryan Rodgers [id number] DOB: September 30, 1951 DOA: 03/29/2022  DOS: the patient was seen and examined on 03/29/2022  PCP: Beckie Salts, MD   Patient coming from: Home  I have personally briefly reviewed patient's old medical records in [place]  CC: frequent falls HPI: 71 year old white male with a history of chronic systolic heart failure EF of 20 to 25%, status post ICD, chronic opiate and benzodiazepine use/abuse, chronic back pain, hypertension, reflux, coronary disease, history of ventricular tachycardia, COPD, chronic hypoxic respiratory failure on home oxygen who presented to the [place] ER.  Please note the patient receives ALL of his medical care at [facility] in [place].  He has never been to [facility] outpatient practice or in a [facility] hospital before.  He was last hospitalized December 23, 2021 at [facility] for syncope and ventricular tachycardia.  He was discharged on home oxygen.  Apparently he was not wearing it and his sister sent the machine back home.  During this hospitalization, patient noted to be orthostatic.  Patient has a history of orthostasis.  He presented to the [place] ER today hypoxic.  Apparently ER cannot arrange for him to get oxygen.  They also declined to send the patient back to [facility] where he normally gets all of his medical care.  Patient transferred to [facility].  Reportedly EDP talked with the patient's sister.  Patient has been abusing his hydrocodone and Xanax.  Patient is sleeping and does not want to wake up to answer any of my questions.     ED Course: Noted be hypoxic in the ER.  EDP declined to send the patient back to his base hospital at [facility].  Review of Systems:  Review of Systems  Unable to perform ROS: Other   Pt sleeping. Refused to wake up to answer my questions.  Past Medical History:  Diagnosis Date    Benign localized prostatic hyperplasia with lower urinary tract symptoms (LUTS) 10/20/2013   Chronic airway obstruction (HCC) 01/24/2014   Chronic bilateral low back pain without sciatica 10/20/2013   Chronic prescription opiate use [id number]   Chronic systolic CHF (congestive heart failure) ([place]) - by echo 02-23-2022 LVEF 20-25% 01/24/2014   Formatting of this note might be different from the original. EF 30% cath jan 2010, on beta blocker and ACEI Formatting of this note might be different from the original. Overview:  EF 30% cath jan 2010, on beta blocker and ACEI  Last Assessment & Plan:  Formatting of this note might be different from the original. Stable   Coronary artery disease involving native coronary artery of native heart without angina pectoris 08/28/2015   Formatting of this note might be different from the original. Diagnostic Procedure Summary Diffuse non-obstructive coronary artery disease. LAD stent patent Severe anterolateral segmental LV systolic dysfunction. Severe chronic stenosis ostial OM2, small branch. OM1 nonvisualized, likely CTO EF 40% Diagnostic Procedure Recommendations Medical therapy for CAD, LV dysfunction Medical therapy for LV    Esophageal reflux 01/24/2014   Essential hypertension 01/24/2014   ICD (implantable cardioverter-defibrillator) in place 01/28/2018   Last Assessment & Plan:  Formatting of this note might be different from the original. Normal Device Function Resolved - Data consistent with possible HF exacerbation resolved No Permanent Device Reprogramming Required  Since D/C he has not had any sustained arrhythmias recorded -  Follow up 3-4 months, remote monitoring on  a quarterly basis, as per protocol.  Encouraged follow up with PCP and Car   Orthostatic hypotension 03/30/2022   Other ventricular tachycardia (HCC) 10/14/2017   Last Assessment & Plan:  Formatting of this note might be different from the original. NSVT -  Stable    History reviewed. No pertinent  surgical history.   reports that he has quit smoking. His smoking use included cigarettes. He does not have any smokeless tobacco history on file. No history on file for alcohol use and drug use.  Allergies  Allergen Reactions   Bee Venom     Other reaction(s): Other (See Comments) dont know what will do after heart attack    Buspirone     Other reaction(s): Other (See Comments) Muscle cramps. Muscle cramps.    Iodinated Contrast Media Rash    Nuclear med dye, sister states can take CT dye   Sulfa Antibiotics Rash    Family History  Family history unknown: Yes    Prior to Admission medications   Medication Sig Start Date End Date Taking? Authorizing Provider  aspirin EC 81 MG tablet Take by mouth. 06/07/20  Yes [provider]  ALPRAZolam Prudy Feeler) 1 MG tablet Take 1 mg by mouth 2 (two) times daily as needed. 03/23/22   [provider]  BREO ELLIPTA 100-25 MCG/ACT AEPB Inhale 1 puff into the lungs daily. 03/20/22   [provider]  cefdinir (OMNICEF) 300 MG capsule Take 300 mg by mouth 2 (two) times daily. 02/08/22   [provider]  diclofenac Sodium (VOLTAREN) 1 % GEL Apply topically. 03/19/22   [provider]  ENTRESTO 24-26 MG Take 1 tablet by mouth 2 (two) times daily. 02/27/22   [provider]  finasteride (PROSCAR) 5 MG tablet Take 5 mg by mouth daily. 01/08/22   [provider]  HYDROcodone-acetaminophen (NORCO/VICODIN) 5-325 MG tablet Take 1 tablet by mouth 3 (three) times daily as needed. 03/20/22   [provider]  metoprolol succinate (TOPROL-XL) 25 MG 24 hr tablet Take 25 mg by mouth 2 (two) times daily. 01/08/22   [provider]  mexiletine (MEXITIL) 150 MG capsule Take by mouth. 03/05/22   [provider]  omeprazole (PRILOSEC) 40 MG capsule Take 40 mg by mouth daily. 03/05/22   [provider]  ondansetron (ZOFRAN-ODT) 4 MG disintegrating tablet Take by mouth. 02/08/22    [provider]  potassium chloride SA (KLOR-CON M) 20 MEQ tablet Take 20 mEq by mouth 2 (two) times daily. 01/08/22   [provider]  predniSONE (DELTASONE) 10 MG tablet Take by mouth. 02/08/22   [provider]  rosuvastatin (CRESTOR) 20 MG tablet Take 20 mg by mouth at bedtime. 01/08/22   [provider]  spironolactone (ALDACTONE) 25 MG tablet Take 12.5 mg by mouth daily. 03/25/22   [provider]  tamsulosin (FLOMAX) 0.4 MG CAPS capsule Take 0.8 mg by mouth daily. 01/08/22   [provider]  venlafaxine XR (EFFEXOR-XR) 150 MG 24 hr capsule Take 150 mg by mouth daily. 01/09/22   [provider]    Physical Exam: Vitals:   03/29/22 2155 03/29/22 2200 03/29/22 2230 03/30/22 0148  BP:  (!) 99/58 (!) 92/52 94/61  Pulse:  85 88 76  Resp:  16 (!) 21 16  Temp:    97.8 F (36.6 C)  TempSrc:    Oral  SpO2:  98% 93% 91%  Weight:      Height: 5\' 8"  (1.727 m)  Physical Exam Constitutional:      General: He is not in acute distress.    Appearance: He is normal weight. He is not ill-appearing, toxic-appearing or diaphoretic.  HENT:     Head: Normocephalic and atraumatic.     Nose: Nose normal.  Cardiovascular:     Rate and Rhythm: Normal rate and regular rhythm.  Pulmonary:     Effort: Pulmonary effort is normal. No respiratory distress.     Breath sounds: No wheezing or rales.  Abdominal:     General: Bowel sounds are normal. There is no distension.     Palpations: Abdomen is soft.     Tenderness: There is no abdominal tenderness.     Hernia: No hernia is present.  Musculoskeletal:     Right lower leg: No edema.     Left lower leg: No edema.  Skin:    General: Skin is warm and dry.      Labs on Admission: I have personally reviewed following labs and imaging studies  CBC: Recent Labs  Lab 03/29/22 1910 03/29/22 2027  WBC 6.2  --   NEUTROABS 4.4  --   HGB 14.8 13.9  HCT 46.4 41.0  MCV 86.4  --   PLT 149*   --    Basic Metabolic Panel: Recent Labs  Lab 03/29/22 1910 03/29/22 2027  NA 139 140  K 3.0* 2.9*  CL 104  --   CO2 27  --   GLUCOSE 122*  --   BUN 16  --   CREATININE 1.49*  --   CALCIUM 8.5*  --    GFR: Estimated Creatinine Clearance: 43.4 mL/min (A) (by C-G formula based on SCr of 1.49 mg/dL (H)). Liver Function Tests: Recent Labs  Lab 03/29/22 1910  AST 26  ALT 25  ALKPHOS 75  BILITOT 0.8  PROT 6.2*  ALBUMIN 3.3*   No results for input(s): "LIPASE", "AMYLASE" in the last 168 hours. Recent Labs  Lab 03/29/22 2018  AMMONIA 34   Coagulation Profile: Recent Labs  Lab 03/29/22 1910  INR 1.1   Cardiac Enzymes: Recent Labs  Lab 03/29/22 1910  TROPONINIHS 12   BNP (last 3 results) No results for input(s): "PROBNP" in the last 8760 hours. HbA1C: No results for input(s): "HGBA1C" in the last 72 hours. CBG: Recent Labs  Lab 03/29/22 1901  GLUCAP 149*   Lipid Profile: No results for input(s): "CHOL", "HDL", "LDLCALC", "TRIG", "CHOLHDL", "LDLDIRECT" in the last 72 hours. Thyroid Function Tests: No results for input(s): "TSH", "T4TOTAL", "FREET4", "T3FREE", "THYROIDAB" in the last 72 hours. Anemia Panel: No results for input(s): "VITAMINB12", "FOLATE", "FERRITIN", "TIBC", "IRON", "RETICCTPCT" in the last 72 hours. Urine analysis:    Component Value Date/Time   COLORURINE YELLOW 03/29/2022 2141   APPEARANCEUR CLEAR 03/29/2022 2141   LABSPEC 1.010 03/29/2022 2141   PHURINE 6.0 03/29/2022 2141   GLUCOSEU NEGATIVE 03/29/2022 2141   HGBUR TRACE (A) 03/29/2022 2141   BILIRUBINUR NEGATIVE 03/29/2022 2141   [place] NEGATIVE 03/29/2022 2141   PROTEINUR NEGATIVE 03/29/2022 2141   NITRITE NEGATIVE 03/29/2022 2141   LEUKOCYTESUR NEGATIVE 03/29/2022 2141    Radiological Exams on Admission: I have personally reviewed images CT Chest Wo Contrast  Result Date: 03/29/2022 CLINICAL DATA:  Shortness of breath. EXAM: CT CHEST WITHOUT CONTRAST TECHNIQUE:  Multidetector CT imaging of the chest was performed following the standard protocol without IV contrast. RADIATION DOSE REDUCTION: This exam was performed according to the departmental dose-optimization program which includes automated exposure control,  adjustment of the mA and/or kV according to patient size and/or use of iterative reconstruction technique. COMPARISON:  CT dated 12/17/2012 and chest radiograph dated 03/29/2022. FINDINGS: Evaluation of this exam is limited in the absence of intravenous contrast. Cardiovascular: There is no cardiomegaly or pericardial effusion. Advanced 3 vessel coronary vascular calcification. Mild atherosclerotic calcification of the thoracic aorta. No aneurysmal dilatation. The central pulmonary arteries are grossly unremarkable. Left sided pacemaker device. Mediastinum/Nodes: No hilar or mediastinal adenopathy. The esophagus and thyroid gland are grossly unremarkable. No mediastinal fluid collection. Lungs/Pleura: Severe centrilobular emphysema. Bilateral lower lobe linear scarring as well as areas of scarring in the left upper lobe. The left upper lobe scarring as nodular components measuring up to 13 mm. No consolidative changes. There is no pleural effusion or pneumothorax. The central airways are patent. Upper Abdomen: Multiple gallstones.  A 3.5 cm left renal pole cyst. Musculoskeletal: No acute osseous pathology. IMPRESSION: 1. No acute intrathoracic pathology. 2. Left upper lobe nodular scarring. Non-contrast chest CT at 3-6 months is recommended. If the nodules are stable at time of repeat CT, then future CT at 18-24 months (from today's scan) is considered optional for low-risk patients, but is recommended for high-risk patients. This recommendation follows the consensus statement: Guidelines for Management of Incidental Pulmonary Nodules Detected on CT Images: From the Fleischner Society 2017; Radiology 2017; 284:228-243. 3. Cholelithiasis. 4. Aortic Atherosclerosis  (ICD10-I70.0) and Emphysema (ICD10-J43.9). Electronically Signed   By: Anner Crete M.D.   On: 03/29/2022 22:56   DG Chest Port 1 View  Result Date: 03/29/2022 CLINICAL DATA:  Weakness, fall 3 days ago. EXAM: PORTABLE CHEST 1 VIEW COMPARISON:  02/07/2022. FINDINGS: Heart is enlarged the mediastinal contour is stable. The pulmonary vasculature is within normal limits. Atherosclerotic calcification of the aorta is noted. A multi lead pacemaker device is present over the left chest. Lung volumes are low with mild atelectasis or infiltrate at the lung bases. There are small bilateral pleural effusions. No pneumothorax. No acute osseous abnormality. IMPRESSION: 1. Small pleural effusions with atelectasis at the lung bases. 2. Cardiomegaly. Electronically Signed   By: Brett Fairy M.D.   On: 03/29/2022 20:08   CT HEAD WO CONTRAST  Result Date: 03/29/2022 CLINICAL DATA:  Head trauma, abnormal mental status (Age 23-64y) EXAM: CT HEAD WITHOUT CONTRAST TECHNIQUE: Contiguous axial images were obtained from the base of the skull through the vertex without intravenous contrast. RADIATION DOSE REDUCTION: This exam was performed according to the departmental dose-optimization program which includes automated exposure control, adjustment of the mA and/or kV according to patient size and/or use of iterative reconstruction technique. COMPARISON:  CT head 02/22/2022 BRAIN: BRAIN Trace patchy areas of decreased attenuation are noted throughout the deep and periventricular white matter of the cerebral hemispheres bilaterally, compatible with chronic microvascular ischemic disease. No evidence of large-territorial acute infarction. No parenchymal hemorrhage. No mass lesion. No extra-axial collection. No mass effect or midline shift. No hydrocephalus. Basilar cisterns are patent. Vascular: No hyperdense vessel. Atherosclerotic calcifications are present within the cavernous internal carotid arteries. Skull: No acute fracture or  focal lesion. Sinuses/Orbits: Paranasal sinuses and mastoid air cells are clear. The orbits are unremarkable. Other: None. IMPRESSION: No acute intracranial abnormality. Electronically Signed   By: Iven Finn M.D.   On: 03/29/2022 19:48    EKG: My personal interpretation of EKG shows: NSR, RBBB    Assessment/Plan Principal Problem:   Dehydration Active Problems:   ICD (implantable cardioverter-defibrillator) in place -  Medtronic pacemaker.  Model EVERA  MRI XT DR [id number].  Serial number [id number]    Chronic airway obstruction (HCC)   Chronic systolic CHF (congestive heart failure) ([place]) - by echo 02-23-2022 LVEF 20-25%   Orthostatic hypotension   Chronic respiratory failure with hypoxia (HCC)   Hypokalemia    Assessment and Plan: * Dehydration Assigned to observation status.  He was given small bolus of fluid.  Would not give him any more IV fluids given his severely depressed EF.  He needs to stop his Entresto.  Repeat orthostatic vital signs.  Hypokalemia Acute.  We will replete with p.o. KCl 40 meq every 4 hours x2.  Chronic respiratory failure with hypoxia (HCC) According to his discharge summary he was sent home on home oxygen.  EDP got the history from the patient's sister that he was not using his oxygen and she sent the machine back to the respiratory company.  Will need case management to get another home oxygen concentrator for him.  Patient need to follow-up with his [facility] primary care physician.  Orthostatic hypotension Chronic problem for him.  He was orthostatic during his last hospital admission at [facility].  He received some IV fluids there.  Presented with orthostasis in the ER as well.  He was given 500 cc of fluid.  Given his severely depressed EF, may need to use midodrine instead of giving him IV fluids.  Repeat his orthostatic vital signs this morning.  He needs to wear TED hose.  Chronic systolic CHF (congestive heart failure) ([place]) -  by echo 02-23-2022 LVEF 20-25% Actively followed by cardiology at [facility].  From his last discharge, it appears that he was restarted on his Entresto with instructions to stop it and to change back to just ACE inhibitor if his blood pressure got too low.  Given his orthostasis, probably need to stop his Entresto altogether.  Leave him on his amiodarone and mexiletine, Aldactone and sent him back to his [facility] outpatient cardiologist for them to manage his chronic systolic heart failure.   Chronic airway obstruction ([place]) Followed by [facility] internal medicine clinic  ICD (implantable cardioverter-defibrillator) in place -  Medtronic pacemaker.  Model EVERA MRI XT DR [id number].  Serial number [id number]  Follows with [facility] cardiology in [place].  Per [facility] records, patient has a Medtronic pacemaker.  Model EVERA MRI XT DR [id number].  Serial number [id number]    DVT prophylaxis: SQ Heparin Code Status: Full Code by default Family Communication: no family at bedside  Disposition Plan: return home  Consults called: none  Admission status: Observation, Telemetry bed   Kristopher Oppenheim, DO [facility] 03/30/2022, 5:13 AM

## 2022-03-30 NOTE — Plan of Care (Signed)

## 2022-03-30 NOTE — Progress Notes (Signed)
PROGRESS NOTE    Ryan Rodgers  [id number] DOB: Apr 05, 1951 DOA: 03/29/2022 PCP: Beckie Salts, MD     Brief Narrative:  71 year old WM PMHx Chronic Systolic CHF(EF of 20 to [id number]), S/P ICD, HTN, CAD, ventricular tachycardia, chronic opiate and benzodiazepine use, Drug abuse, chronic back pain,  reflux, coronary disease, COPD, Chronic respiratory failure with hypoxia on home O2, noncompliance with medication  Presented to the [place] ER.   Please note the patient receives ALL of his medical care at [facility] in [place].  He has never been to [facility] outpatient practice or in a [facility] hospital before.   He was last hospitalized December 23, 2021 at [facility] for syncope and ventricular tachycardia.  He was discharged on home oxygen.  Apparently he was not wearing it and his sister sent the machine back home.  During this hospitalization, patient noted to be orthostatic.  Patient has a history of orthostasis.  He presented to the [place] ER today hypoxic.  Apparently ER cannot arrange for him to get oxygen.  They also declined to send the patient back to [facility] where he normally gets all of his medical care.   Patient transferred to [facility]   Reportedly EDP talked with the patient's sister.  Patient has been abusing his hydrocodone and Xanax.   Patient is sleeping and does not want to wake up to answer any of my questions.     Subjective: A/O x4, patient refuses ALL other pain medication except for narcotics.  Refuses anxiety medication except for Xanax.   Assessment & Plan: Covid vaccination;   Principal Problem:   Dehydration Active Problems:   ICD (implantable cardioverter-defibrillator) in place -  Medtronic pacemaker.  Model EVERA MRI XT DR [id number].  Serial number [id number]    Chronic airway obstruction (HCC)   Chronic systolic CHF (congestive heart failure) ([place]) - by echo 02-23-2022 LVEF  20-25%   Orthostatic hypotension   Chronic respiratory failure with hypoxia (HCC)   Hypokalemia  Dehydration Assigned to observation status.  He was given small bolus of fluid.  Would not give him any more IV fluids given his severely depressed EF.  He needs to stop his Entresto.  Repeat orthostatic vital signs.   Hypokalemia Acute.  We will replete with p.o. KCl 40 meq every 4 hours x2.   Chronic respiratory failure with hypoxia (HCC) According to his discharge summary he was sent home on home oxygen.  EDP got the history from the patient's sister that he was not using his oxygen and she sent the machine back to the respiratory company.  Will need case management to get another home oxygen concentrator for him.  Patient need to follow-up with his [facility] primary care physician. -Titrate O2 to maintain SPO2 89 to 95%  Chronic airway obstruction (HCC) Followed by [facility] internal medicine clinic   Orthostatic hypotension Chronic problem for him.  He was orthostatic during his last hospital admission at [facility].  He received some IV fluids there.  Presented with orthostasis in the ER as well.  He was given 500 cc of fluid.  Given his severely depressed EF, may need to use midodrine instead of giving him IV fluids.  Repeat his orthostatic vital signs this morning.  He needs to wear TED hose.   Chronic systolic CHF (congestive heart failure) ([place]) - by echo 02-23-2022 LVEF 20-25% Actively followed by cardiology  at [facility].  From his last discharge, it appears that he was restarted on his Entresto with instructions to stop it and to change back to just ACE inhibitor if his blood pressure got too low.  Given his orthostasis, probably need to stop his Entresto altogether.  Leave him on his amiodarone and mexiletine, Aldactone and sent him back to his [facility] outpatient cardiologist for them to manage his chronic systolic heart  failure.     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  Model EVERA MRI XT DR [id number].  Serial number [id number]  Follows with [facility] cardiology in [place].  Per [facility] records, patient has a Medtronic pacemaker.  Model EVERA MRI XT DR [id number].  Serial number [id number]    Drug abuse/EtOH abuse - 6/9 urine rapid drug screen pending - 6/9 EtOH pending -6/10 EtOH<10        Mobility Assessment (last 72 hours)     Mobility Assessment     Row Name 03/30/22 0600 03/30/22 0200         Does patient have an order for bedrest or is patient medically unstable No - Continue assessment No - Continue assessment      What is the highest level of mobility based on the progressive mobility assessment? Level 3 (Stands with assist) - Balance while standing  and cannot march in place Level 1 (Bedfast) - Unable to balance while sitting on edge of bed      Is the above level different from baseline mobility prior to current illness? Yes - Recommend PT order Yes - Recommend PT order                 Interdisciplinary Goals of Care Family Meeting   Date carried out: 03/30/2022  Location of the meeting:   Member's involved:   Durable Power of Tour manager:     Discussion: We discussed goals of care for [employer] .    Code status:   Disposition:   Time spent for the meeting:     Jasson Siegmann J, MD  03/30/2022, 8:26 AM         DVT prophylaxis: Heparin Code Status: Full Family Communication:  Status is: Inpatient    Dispo: The patient is from: Home              Anticipated d/c is to:??              Anticipated d/c date is: > Days              Patient currently is not medically stable to d/c.      Consultants:    Procedures/Significant Events:    I have personally reviewed and interpreted all radiology studies and my findings are as above.  VENTILATOR  SETTINGS:    Cultures   Antimicrobials:    Devices    LINES / TUBES:      Continuous Infusions:   Objective: Vitals:   03/29/22 2200 03/29/22 2230 03/30/22 0148 03/30/22 0636  BP: (!) 99/58 (!) 92/52 94/61 (!) 92/53  Pulse: 85 88 76 74  Resp: 16 (!) 21 16 17   Temp:   97.8 F (36.6 C) 97.7 F (36.5 C)  TempSrc:   Oral Oral  SpO2: 98% 93% 91% 98%  Weight:      Height:        Intake/Output Summary (Last 24 hours) at 03/30/2022 [id number] Last data  filed at 03/30/2022 0200 Gross per 24 hour  Intake 500 ml  Output --  Net 500 ml   Filed Weights   03/29/22 1907  Weight: 75.4 kg    Examination:  Patient seen by [facility] on 03/30/2022 no charge .     Data Reviewed: Care during the described time interval was provided by me .  I have reviewed this patient's available data, including medical history, events of note, physical examination, and all test results as part of my evaluation.  CBC: Recent Labs  Lab 03/29/22 1910 03/29/22 2027 03/30/22 0741  WBC 6.2  --  5.4  NEUTROABS 4.4  --   --   HGB 14.8 13.9 14.3  HCT 46.4 41.0 44.9  MCV 86.4  --  88.6  PLT 149*  --  0000000*   Basic Metabolic Panel: Recent Labs  Lab 03/29/22 1910 03/29/22 2027  NA 139 140  K 3.0* 2.9*  CL 104  --   CO2 27  --   GLUCOSE 122*  --   BUN 16  --   CREATININE 1.49*  --   CALCIUM 8.5*  --    GFR: Estimated Creatinine Clearance: 43.4 mL/min (A) (by C-G formula based on SCr of 1.49 mg/dL (H)). Liver Function Tests: Recent Labs  Lab 03/29/22 1910  AST 26  ALT 25  ALKPHOS 75  BILITOT 0.8  PROT 6.2*  ALBUMIN 3.3*   No results for input(s): "LIPASE", "AMYLASE" in the last 168 hours. Recent Labs  Lab 03/29/22 2018  AMMONIA 34   Coagulation Profile: Recent Labs  Lab 03/29/22 1910  INR 1.1   Cardiac Enzymes: No results for input(s): "CKTOTAL", "CKMB", "CKMBINDEX", "TROPONINI" in the last 168 hours. BNP (last 3 results) No results for input(s): "PROBNP" in the last  8760 hours. HbA1C: No results for input(s): "HGBA1C" in the last 72 hours. CBG: Recent Labs  Lab 03/29/22 1901  GLUCAP 149*   Lipid Profile: No results for input(s): "CHOL", "HDL", "LDLCALC", "TRIG", "CHOLHDL", "LDLDIRECT" in the last 72 hours. Thyroid Function Tests: No results for input(s): "TSH", "T4TOTAL", "FREET4", "T3FREE", "THYROIDAB" in the last 72 hours. Anemia Panel: No results for input(s): "VITAMINB12", "FOLATE", "FERRITIN", "TIBC", "IRON", "RETICCTPCT" in the last 72 hours. Sepsis Labs: No results for input(s): "PROCALCITON", "LATICACIDVEN" in the last 168 hours.  No results found for this or any previous visit (from the past 240 hour(s)).       Radiology Studies: CT Chest Wo Contrast  Result Date: 03/29/2022 CLINICAL DATA:  Shortness of breath. EXAM: CT CHEST WITHOUT CONTRAST TECHNIQUE: Multidetector CT imaging of the chest was performed following the standard protocol without IV contrast. RADIATION DOSE REDUCTION: This exam was performed according to the departmental dose-optimization program which includes automated exposure control, adjustment of the mA and/or kV according to patient size and/or use of iterative reconstruction technique. COMPARISON:  CT dated 12/17/2012 and chest radiograph dated 03/29/2022. FINDINGS: Evaluation of this exam is limited in the absence of intravenous contrast. Cardiovascular: There is no cardiomegaly or pericardial effusion. Advanced 3 vessel coronary vascular calcification. Mild atherosclerotic calcification of the thoracic aorta. No aneurysmal dilatation. The central pulmonary arteries are grossly unremarkable. Left sided pacemaker device. Mediastinum/Nodes: No hilar or mediastinal adenopathy. The esophagus and thyroid gland are grossly unremarkable. No mediastinal fluid collection. Lungs/Pleura: Severe centrilobular emphysema. Bilateral lower lobe linear scarring as well as areas of scarring in the left upper lobe. The left upper lobe  scarring as nodular components measuring up to 13 mm. No consolidative  changes. There is no pleural effusion or pneumothorax. The central airways are patent. Upper Abdomen: Multiple gallstones.  A 3.5 cm left renal pole cyst. Musculoskeletal: No acute osseous pathology. IMPRESSION: 1. No acute intrathoracic pathology. 2. Left upper lobe nodular scarring. Non-contrast chest CT at 3-6 months is recommended. If the nodules are stable at time of repeat CT, then future CT at 18-24 months (from today's scan) is considered optional for low-risk patients, but is recommended for high-risk patients. This recommendation follows the consensus statement: Guidelines for Management of Incidental Pulmonary Nodules Detected on CT Images: From the Fleischner Society 2017; Radiology 2017; 284:228-243. 3. Cholelithiasis. 4. Aortic Atherosclerosis (ICD10-I70.0) and Emphysema (ICD10-J43.9). Electronically Signed   By: Anner Crete M.D.   On: 03/29/2022 22:56   DG Chest Port 1 View  Result Date: 03/29/2022 CLINICAL DATA:  Weakness, fall 3 days ago. EXAM: PORTABLE CHEST 1 VIEW COMPARISON:  02/07/2022. FINDINGS: Heart is enlarged the mediastinal contour is stable. The pulmonary vasculature is within normal limits. Atherosclerotic calcification of the aorta is noted. A multi lead pacemaker device is present over the left chest. Lung volumes are low with mild atelectasis or infiltrate at the lung bases. There are small bilateral pleural effusions. No pneumothorax. No acute osseous abnormality. IMPRESSION: 1. Small pleural effusions with atelectasis at the lung bases. 2. Cardiomegaly. Electronically Signed   By: Brett Fairy M.D.   On: 03/29/2022 20:08   CT HEAD WO CONTRAST  Result Date: 03/29/2022 CLINICAL DATA:  Head trauma, abnormal mental status (Age 49-64y) EXAM: CT HEAD WITHOUT CONTRAST TECHNIQUE: Contiguous axial images were obtained from the base of the skull through the vertex without intravenous contrast. RADIATION DOSE  REDUCTION: This exam was performed according to the departmental dose-optimization program which includes automated exposure control, adjustment of the mA and/or kV according to patient size and/or use of iterative reconstruction technique. COMPARISON:  CT head 02/22/2022 BRAIN: BRAIN Trace patchy areas of decreased attenuation are noted throughout the deep and periventricular white matter of the cerebral hemispheres bilaterally, compatible with chronic microvascular ischemic disease. No evidence of large-territorial acute infarction. No parenchymal hemorrhage. No mass lesion. No extra-axial collection. No mass effect or midline shift. No hydrocephalus. Basilar cisterns are patent. Vascular: No hyperdense vessel. Atherosclerotic calcifications are present within the cavernous internal carotid arteries. Skull: No acute fracture or focal lesion. Sinuses/Orbits: Paranasal sinuses and mastoid air cells are clear. The orbits are unremarkable. Other: None. IMPRESSION: No acute intracranial abnormality. Electronically Signed   By: Iven Finn M.D.   On: 03/29/2022 19:48        Scheduled Meds:  amiodarone  200 mg Oral BID   heparin  5,000 Units Subcutaneous Q8H   mexiletine  150 mg Oral Q12H   midodrine  2.5 mg Oral TID WC   potassium chloride  40 mEq Oral BID   spironolactone  12.5 mg Oral Daily   Continuous Infusions:   LOS: 0 days    Time spent:5 min    Huckleberry Martinson, Geraldo Docker, MD [facility]   If 7PM-7AM, please contact night-coverage 03/30/2022, 8:26 AM

## 2022-03-30 NOTE — Assessment & Plan Note (Signed)
Chronic problem for him.  He was orthostatic during his last hospital admission at [facility].  He received some IV fluids there.  Presented with orthostasis in the ER as well.  He was given 500 cc of fluid.  Given his severely depressed EF, may need to use midodrine instead of giving him IV fluids.  Repeat his orthostatic vital signs this morning.  He needs to wear TED hose.

## 2022-03-30 NOTE — Assessment & Plan Note (Addendum)
Actively followed by cardiology at [facility].  From his last discharge, it appears that he was restarted on his Entresto with instructions to stop it and to change back to just ACE inhibitor if his blood pressure got too low.  Given his orthostasis, probably need to stop his Entresto altogether.  Leave him on his amiodarone and mexiletine, Aldactone and sent him back to his [facility] outpatient cardiologist for them to manage his chronic systolic heart failure.

## 2022-03-30 NOTE — Assessment & Plan Note (Signed)
Follows with [facility] cardiology in [place].  Per [facility] records, patient has a Medtronic pacemaker.  Model EVERA MRI XT DR [id number].  Serial number [id number]

## 2022-03-30 NOTE — Subjective & Objective (Signed)
CC: frequent falls HPI: 71 year old white male with a history of chronic systolic heart failure EF of 20 to 25%, status post ICD, chronic opiate and benzodiazepine use/abuse, chronic back pain, hypertension, reflux, coronary disease, history of ventricular tachycardia, COPD, chronic hypoxic respiratory failure on home oxygen who presented to the med [facility] ER.  Please note the patient receives ALL of his medical care at [facility] in [place].  He has never been to [facility] outpatient practice or in a [facility] hospital before.  He was last hospitalized December 23, 2021 at [facility] for syncope and ventricular tachycardia.  He was discharged on home oxygen.  Apparently he was not wearing it and his sister sent the machine back home.  During this hospitalization, patient noted to be orthostatic.  Patient has a history of orthostasis.  He presented to the med [facility] ER today hypoxic.  Apparently ER cannot arrange for him to get oxygen.  They also declined to send the patient back to [facility] where he normally gets all of his medical care.  Patient transferred to [facility].  Reportedly EDP talked with the patient's sister.  Patient has been abusing his hydrocodone and Xanax.  Patient is sleeping and does not want to wake up to answer any of my questions.

## 2022-03-30 NOTE — Assessment & Plan Note (Signed)
Assigned to observation status.  He was given small bolus of fluid.  Would not give him any more IV fluids given his severely depressed EF.  He needs to stop his Entresto.  Repeat orthostatic vital signs.

## 2022-03-30 NOTE — Assessment & Plan Note (Signed)
Followed by [facility] internal medicine clinic

## 2022-03-30 NOTE — Assessment & Plan Note (Signed)
According to his discharge summary he was sent home on home oxygen.  EDP got the history from the patient's sister that he was not using his oxygen and she sent the machine back to the respiratory company.  Will need case management to get another home oxygen concentrator for him.  Patient need to follow-up with his [facility] primary care physician.

## 2022-03-30 NOTE — Progress Notes (Signed)
RN informed by Pt that he wants to leave AMA. RN explained to Pt and Pt's sister the benefits of staying and receiving care here until released by MD and the risks of leaving AMA. Pt and sister talking on the phone to reach a decision.  Endorsed accordingly to Imogene, Charity fundraiser.

## 2022-03-30 NOTE — Assessment & Plan Note (Signed)
Acute.  We will replete with p.o. KCl 40 meq every 4 hours x2.

## 2022-03-31 DIAGNOSIS — R296 Repeated falls: Secondary | ICD-10-CM

## 2022-03-31 DIAGNOSIS — I509 Heart failure, unspecified: Secondary | ICD-10-CM | POA: Diagnosis not present

## 2022-03-31 DIAGNOSIS — R531 Weakness: Secondary | ICD-10-CM

## 2022-03-31 DIAGNOSIS — E860 Dehydration: Secondary | ICD-10-CM | POA: Diagnosis not present

## 2022-03-31 DIAGNOSIS — I959 Hypotension, unspecified: Secondary | ICD-10-CM

## 2022-03-31 DIAGNOSIS — J9611 Chronic respiratory failure with hypoxia: Secondary | ICD-10-CM | POA: Diagnosis not present

## 2022-03-31 DIAGNOSIS — Z765 Malingerer [conscious simulation]: Secondary | ICD-10-CM

## 2022-03-31 LAB — COMPREHENSIVE METABOLIC PANEL
ALT: 23 U/L (ref 0–44)
AST: 18 U/L (ref 15–41)
Albumin: 2.9 g/dL — ABNORMAL LOW (ref 3.5–5.0)
Alkaline Phosphatase: 60 U/L (ref 38–126)
Anion gap: 7 (ref 5–15)
BUN: 15 mg/dL (ref 8–23)
CO2: 28 mmol/L (ref 22–32)
Calcium: 8.6 mg/dL — ABNORMAL LOW (ref 8.9–10.3)
Chloride: 111 mmol/L (ref 98–111)
Creatinine, Ser: 1.06 mg/dL (ref 0.61–1.24)
GFR, Estimated: >60 mL/min (ref >60)
Glucose, Bld: 95 mg/dL (ref 70–99)
Potassium: 3.6 mmol/L (ref 3.5–5.1)
Sodium: 146 mmol/L — ABNORMAL HIGH (ref 135–145)
Total Bilirubin: 1.1 mg/dL (ref 0.3–1.2)
Total Protein: 5.6 g/dL — ABNORMAL LOW (ref 6.5–8.1)

## 2022-03-31 LAB — PHOSPHORUS: Phosphorus: 3.3 mg/dL (ref 2.5–4.6)

## 2022-03-31 LAB — CBC WITH DIFFERENTIAL/PLATELET
Abs Immature Granulocytes: 0.06 10*3/uL (ref 0.00–0.07)
Basophils Absolute: 0 10*3/uL (ref 0.0–0.1)
Basophils Relative: 1 %
Eosinophils Absolute: 0.1 10*3/uL (ref 0.0–0.5)
Eosinophils Relative: 2 %
HCT: 41.8 % (ref 39.0–52.0)
Hemoglobin: 12.9 g/dL — ABNORMAL LOW (ref 13.0–17.0)
Immature Granulocytes: 1 %
Lymphocytes Relative: 20 %
Lymphs Abs: 0.9 10*3/uL (ref 0.7–4.0)
MCH: 27.7 pg (ref 26.0–34.0)
MCHC: 30.9 g/dL (ref 30.0–36.0)
MCV: 89.7 fL (ref 80.0–100.0)
Monocytes Absolute: 0.6 10*3/uL (ref 0.1–1.0)
Monocytes Relative: 14 %
Neutro Abs: 2.7 10*3/uL (ref 1.7–7.7)
Neutrophils Relative %: 62 %
Platelets: 131 10*3/uL — ABNORMAL LOW (ref 150–400)
RBC: 4.66 MIL/uL (ref 4.22–5.81)
RDW: 19.2 % — ABNORMAL HIGH (ref 11.5–15.5)
WBC: 4.3 10*3/uL (ref 4.0–10.5)
nRBC: 0 % (ref 0.0–0.2)

## 2022-03-31 LAB — RAPID URINE DRUG SCREEN, HOSP PERFORMED
Amphetamines: NOT DETECTED
Barbiturates: NOT DETECTED
Benzodiazepines: POSITIVE — AB
Cocaine: NOT DETECTED
Opiates: POSITIVE — AB
Tetrahydrocannabinol: POSITIVE — AB

## 2022-03-31 LAB — MAGNESIUM: Magnesium: 2 mg/dL (ref 1.7–2.4)

## 2022-03-31 MED ORDER — MIDODRINE HCL 5 MG PO TABS
5.0000 mg | ORAL_TABLET | Freq: Three times a day (TID) | ORAL | Status: DC
  Administered 2022-03-31 – 2022-04-02 (×6): 5 mg via ORAL
  Filled 2022-03-31 (×6): qty 1

## 2022-03-31 MED ORDER — POTASSIUM CHLORIDE CRYS ER 10 MEQ PO TBCR
50.0000 meq | EXTENDED_RELEASE_TABLET | Freq: Once | ORAL | Status: AC
  Administered 2022-03-31: 50 meq via ORAL
  Filled 2022-03-31: qty 1

## 2022-03-31 MED ORDER — SODIUM CHLORIDE 0.45 % IV SOLN: INTRAVENOUS | Status: DC

## 2022-03-31 MED ORDER — ALBUMIN HUMAN 25 % IV SOLN
50.0000 g | Freq: Once | INTRAVENOUS | Status: AC
  Administered 2022-03-31: 50 g via INTRAVENOUS
  Filled 2022-03-31: qty 200

## 2022-03-31 MED ORDER — MUSCLE RUB 10-15 % EX CREA
TOPICAL_CREAM | CUTANEOUS | Status: DC | PRN
  Filled 2022-03-31: qty 85

## 2022-03-31 NOTE — Progress Notes (Signed)
Patient attempted to void for the first time since 7 a.m., unable to void, bladder scan read as 103 mls in bladder.  Will continue to monitor.

## 2022-03-31 NOTE — Progress Notes (Signed)
SATURATION QUALIFICATIONS: (This note is used to comply with regulatory documentation for home oxygen)  Patient Saturations on Room Air at Rest = 90%  Patient Saturations on Room Air while Ambulating = 85%  Patient Saturations on 2 Liters of oxygen while Ambulating = 93%  Please briefly explain why patient needs home oxygen: patient desats with exertion when off oxygen

## 2022-03-31 NOTE — Progress Notes (Signed)
PROGRESS NOTE    Ryan Rodgers  [id number] DOB: February 08, 1951 DOA: 03/29/2022 PCP: Beckie Salts, MD     Brief Narrative:  71 year old WM PMHx Chronic Systolic CHF(EF of 20 to [id number]), S/P ICD, HTN, CAD, ventricular tachycardia, chronic opiate and benzodiazepine use, Drug abuse, chronic back pain,  reflux, coronary disease, COPD, Chronic respiratory failure with hypoxia on home O2, noncompliance with medication  Presented to the [place] ER.   Please note the patient receives ALL of his medical care at [facility] in [place].  He has never been to [facility] outpatient practice or in a [facility] hospital before.   He was last hospitalized December 23, 2021 at [facility] for syncope and ventricular tachycardia.  He was discharged on home oxygen.  Apparently he was not wearing it and his sister sent the machine back home.  During this hospitalization, patient noted to be orthostatic.  Patient has a history of orthostasis.  He presented to the [place] ER today hypoxic.  Apparently ER cannot arrange for him to get oxygen.  They also declined to send the patient back to [facility] where he normally gets all of his medical care.   Patient transferred to [facility].   Reportedly EDP talked with the patient's sister.  Patient has been abusing his hydrocodone and Xanax.   Patient is sleeping and does not want to wake up to answer any of my questions.     Subjective: 6/11 afebrile overnight, A/O x4 much more alert today.  Resting in bed comfortably   Assessment & Plan: Covid vaccination;   Principal Problem:   Dehydration Active Problems:   ICD (implantable cardioverter-defibrillator) in place -  Medtronic pacemaker.  Model EVERA MRI XT DR [id number].  Serial number [id number]    Chronic airway obstruction (HCC)   Chronic systolic CHF (congestive heart failure) ([place]) - by echo 02-23-2022 LVEF 20-25%   Orthostatic hypotension    Chronic respiratory failure with hypoxia (HCC)   Hypokalemia  Dehydration -See orthostatic hypotension    Hypokalemia -Potassium goal > 4 - 6/11 K-Dur 50 mEq    Chronic respiratory failure with hypoxia ([place]) According to his discharge summary he was sent home on home oxygen.  EDP got the history from the patient's sister that he was not using his oxygen and she sent the machine back to the respiratory company.  Will need case management to get another home oxygen concentrator for him.  Patient need to follow-up with his [facility] primary care physician. -Titrate O2 to maintain SPO2 89 to 95% -6/11 ambulatory SPO2 SATURATION QUALIFICATIONS: (This note is used to comply with regulatory documentation for home oxygen) Patient Saturations on Room Air at Rest = 90% Patient Saturations on Room Air while Ambulating = 85% Patient Saturations on 2 Liters of oxygen while Ambulating = 93% Please briefly explain why patient needs home oxygen: patient desats with exertion when off oxygen  -Patient meets criteria for home O2 - 2 L O2 via [place], titrate to maintain SPO2> 92% - Provide patient with [employer] home O2 concentrator -6/11 have requested home O2  Chronic airway obstruction ([place]) Followed by [facility] internal medicine clinic   Orthostatic hypotension Chronic problem for him.  He was orthostatic during his last hospital admission at [facility].  He received some IV fluids there.  Presented with orthostasis in the ER as well.  He was given 500 cc of fluid.  Given his severely depressed EF, may need to use midodrine instead of giving him IV fluids.  Repeat his orthostatic vital signs this morning.  He needs to wear TED hose. -6/11 orthostatic vitals every shift: Patient extremely orthostatic today. -6/11 Albumin 50 g x 1 -6/11 increase Midodrine 5 mg TID -6/11 0.45% saline@ [id number]   Chronic systolic CHF ([place]) - by echo 02-23-2022 LVEF 20-25% -Actively followed by  cardiology at [facility].  From his last discharge, it appears that he was restarted on his Entresto with instructions to stop it and to change back to just ACE inhibitor if his blood pressure got too low.  Given his orthostasis, probably need to stop his Entresto altogether.  Leave him on his amiodarone and mexiletine, Aldactone and sent him back to his [facility] outpatient cardiologist for them to manage his chronic systolic heart failure. -Strict in and out - Daily weight     ICD (implantable cardioverter-defibrillator) in place  -  Medtronic pacemaker.  Model EVERA MRI XT DR [id number].  Serial number [id number]  -Follows with [facility] cardiology in [place].  Per [facility] records, patient has a Medtronic pacemaker.  Model EVERA MRI XT DR [id number].  Serial number [id number]    Drug abuse/EtOH abuse - 6/9 urine rapid drug screen pending - 6/9 EtOH pending -6/10 EtOH<10          Mobility Assessment (last 72 hours)     Mobility Assessment     Row Name 03/30/22 1542 03/30/22 0600 03/30/22 0200       Does patient have an order for bedrest or is patient medically unstable No - Continue assessment No - Continue assessment No - Continue assessment     What is the highest level of mobility based on the progressive mobility assessment? Level 4 (Walks with assist in room) - Balance while marching in place and cannot step forward and back - Complete Level 3 (Stands with assist) - Balance while standing  and cannot march in place Level 1 (Bedfast) - Unable to balance while sitting on edge of bed     Is the above level different from baseline mobility prior to current illness? -- Yes - Recommend PT order Yes - Recommend PT order                Interdisciplinary Goals of Care Family Meeting   Date carried out: 03/31/2022  Location of the meeting:   Member's involved:   Durable Power of Tour manager:      Discussion: We discussed goals of care for [employer] .    Code status:   Disposition:   Time spent for the meeting:     Cedrik Heindl J, MD  03/31/2022, 8:58 AM         DVT prophylaxis: Heparin Code Status: Full Family Communication:  Status is: Inpatient    Dispo: The patient is from: Home              Anticipated d/c is to:??              Anticipated d/c date is: 2> Days              Patient currently is not medically stable to d/c.      Consultants:    Procedures/Significant Events:    I have personally reviewed and interpreted all radiology studies and my findings are as above.  VENTILATOR SETTINGS: Nasal cannula 6/11 Flow  2 L/min SPO2 99%   Cultures   Antimicrobials:    Devices    LINES / TUBES:      Continuous Infusions:   Objective: Vitals:   03/30/22 1539 03/30/22 2117 03/31/22 0500 03/31/22 0734  BP: 110/85 125/76    Pulse: 76 75    Resp: 17 18    Temp: (!) 97.4 F (36.3 C) (!) 97.5 F (36.4 C)    TempSrc: Oral     SpO2: 96% 100%  99%  Weight:   71.3 kg   Height:        Intake/Output Summary (Last 24 hours) at 03/31/2022 0858 Last data filed at 03/30/2022 2030 Gross per 24 hour  Intake 960 ml  Output --  Net 960 ml    Filed Weights   03/29/22 1907 03/31/22 0500  Weight: 75.4 kg 71.3 kg    Physical Exam:  General: A/O x4 No acute respiratory distress, cachectic Eyes: negative scleral hemorrhage, negative anisocoria, negative icterus ENT: Negative Runny nose, negative gingival bleeding, Neck:  Negative scars, masses, torticollis, lymphadenopathy, JVD Lungs: Clear to auscultation bilaterally without wheezes or crackles Cardiovascular: Regular rate and rhythm without murmur gallop or rub normal S1 and S2 Abdomen: negative abdominal pain, nondistended, positive soft, bowel sounds, no rebound, no ascites, no appreciable mass Extremities: No significant cyanosis, clubbing, or edema bilateral lower  extremities Skin: Negative rashes, lesions, ulcers Psychiatric:  Negative depression, negative anxiety, negative fatigue, negative mania  Central nervous system:  Cranial nerves II through XII intact, tongue/uvula midline, all extremities muscle strength 5/5, sensation intact throughout,  negative dysarthria, negative expressive aphasia, negative receptive aphasia. .     Data Reviewed: Care during the described time interval was provided by me .  I have reviewed this patient's available data, including medical history, events of note, physical examination, and all test results as part of my evaluation.  CBC: Recent Labs  Lab 03/29/22 1910 03/29/22 2027 03/30/22 0741 03/31/22 0356  WBC 6.2  --  5.4 4.3  NEUTROABS 4.4  --   --  2.7  HGB 14.8 13.9 14.3 12.9*  HCT 46.4 41.0 44.9 41.8  MCV 86.4  --  88.6 89.7  PLT 149*  --  137* 131*    Basic Metabolic Panel: Recent Labs  Lab 03/29/22 1910 03/29/22 2027 03/30/22 0741 03/30/22 0905 03/31/22 0356  NA 139 140  --  142 146*  K 3.0* 2.9*  --  3.0* 3.6  CL 104  --   --  106 111  CO2 27  --   --  29 28  GLUCOSE 122*  --   --  95 95  BUN 16  --   --  14 15  CREATININE 1.49*  --  1.04 1.13 1.06  CALCIUM 8.5*  --   --  8.1* 8.6*  MG  --   --   --  1.8 2.0  PHOS  --   --   --  3.7 3.3    GFR: Estimated Creatinine Clearance: 60.9 mL/min (by C-G formula based on SCr of 1.06 mg/dL). Liver Function Tests: Recent Labs  Lab 03/29/22 1910 03/30/22 0905 03/31/22 0356  AST 26 23 18   ALT 25 24 23   ALKPHOS 75 61 60  BILITOT 0.8 1.0 1.1  PROT 6.2* 5.7* 5.6*  ALBUMIN 3.3* 3.0* 2.9*    No results for input(s): "LIPASE", "AMYLASE" in the last 168 hours. Recent Labs  Lab 03/29/22 2018  AMMONIA 34    Coagulation Profile: Recent  Labs  Lab 03/29/22 1910  INR 1.1    Cardiac Enzymes: No results for input(s): "CKTOTAL", "CKMB", "CKMBINDEX", "TROPONINI" in the last 168 hours. BNP (last 3 results) No results for input(s):  "PROBNP" in the last 8760 hours. HbA1C: No results for input(s): "HGBA1C" in the last 72 hours. CBG: Recent Labs  Lab 03/29/22 1901  GLUCAP 149*    Lipid Profile: No results for input(s): "CHOL", "HDL", "LDLCALC", "TRIG", "CHOLHDL", "LDLDIRECT" in the last 72 hours. Thyroid Function Tests: No results for input(s): "TSH", "T4TOTAL", "FREET4", "T3FREE", "THYROIDAB" in the last 72 hours. Anemia Panel: No results for input(s): "VITAMINB12", "FOLATE", "FERRITIN", "TIBC", "IRON", "RETICCTPCT" in the last 72 hours. Sepsis Labs: No results for input(s): "PROCALCITON", "LATICACIDVEN" in the last 168 hours.  No results found for this or any previous visit (from the past 240 hour(s)).       Radiology Studies: CT Chest Wo Contrast  Result Date: 03/29/2022 CLINICAL DATA:  Shortness of breath. EXAM: CT CHEST WITHOUT CONTRAST TECHNIQUE: Multidetector CT imaging of the chest was performed following the standard protocol without IV contrast. RADIATION DOSE REDUCTION: This exam was performed according to the departmental dose-optimization program which includes automated exposure control, adjustment of the mA and/or kV according to patient size and/or use of iterative reconstruction technique. COMPARISON:  CT dated 12/17/2012 and chest radiograph dated 03/29/2022. FINDINGS: Evaluation of this exam is limited in the absence of intravenous contrast. Cardiovascular: There is no cardiomegaly or pericardial effusion. Advanced 3 vessel coronary vascular calcification. Mild atherosclerotic calcification of the thoracic aorta. No aneurysmal dilatation. The central pulmonary arteries are grossly unremarkable. Left sided pacemaker device. Mediastinum/Nodes: No hilar or mediastinal adenopathy. The esophagus and thyroid gland are grossly unremarkable. No mediastinal fluid collection. Lungs/Pleura: Severe centrilobular emphysema. Bilateral lower lobe linear scarring as well as areas of scarring in the left upper lobe.  The left upper lobe scarring as nodular components measuring up to 13 mm. No consolidative changes. There is no pleural effusion or pneumothorax. The central airways are patent. Upper Abdomen: Multiple gallstones.  A 3.5 cm left renal pole cyst. Musculoskeletal: No acute osseous pathology. IMPRESSION: 1. No acute intrathoracic pathology. 2. Left upper lobe nodular scarring. Non-contrast chest CT at 3-6 months is recommended. If the nodules are stable at time of repeat CT, then future CT at 18-24 months (from today's scan) is considered optional for low-risk patients, but is recommended for high-risk patients. This recommendation follows the consensus statement: Guidelines for Management of Incidental Pulmonary Nodules Detected on CT Images: From the Fleischner Society 2017; Radiology 2017; 284:228-243. 3. Cholelithiasis. 4. Aortic Atherosclerosis (ICD10-I70.0) and Emphysema (ICD10-J43.9). Electronically Signed   By: Anner Crete M.D.   On: 03/29/2022 22:56   DG Chest Port 1 View  Result Date: 03/29/2022 CLINICAL DATA:  Weakness, fall 3 days ago. EXAM: PORTABLE CHEST 1 VIEW COMPARISON:  02/07/2022. FINDINGS: Heart is enlarged the mediastinal contour is stable. The pulmonary vasculature is within normal limits. Atherosclerotic calcification of the aorta is noted. A multi lead pacemaker device is present over the left chest. Lung volumes are low with mild atelectasis or infiltrate at the lung bases. There are small bilateral pleural effusions. No pneumothorax. No acute osseous abnormality. IMPRESSION: 1. Small pleural effusions with atelectasis at the lung bases. 2. Cardiomegaly. Electronically Signed   By: Brett Fairy M.D.   On: 03/29/2022 20:08   CT HEAD WO CONTRAST  Result Date: 03/29/2022 CLINICAL DATA:  Head trauma, abnormal mental status (Age 20-64y) EXAM: CT HEAD WITHOUT CONTRAST  TECHNIQUE: Contiguous axial images were obtained from the base of the skull through the vertex without intravenous  contrast. RADIATION DOSE REDUCTION: This exam was performed according to the departmental dose-optimization program which includes automated exposure control, adjustment of the mA and/or kV according to patient size and/or use of iterative reconstruction technique. COMPARISON:  CT head 02/22/2022 BRAIN: BRAIN Trace patchy areas of decreased attenuation are noted throughout the deep and periventricular white matter of the cerebral hemispheres bilaterally, compatible with chronic microvascular ischemic disease. No evidence of large-territorial acute infarction. No parenchymal hemorrhage. No mass lesion. No extra-axial collection. No mass effect or midline shift. No hydrocephalus. Basilar cisterns are patent. Vascular: No hyperdense vessel. Atherosclerotic calcifications are present within the cavernous internal carotid arteries. Skull: No acute fracture or focal lesion. Sinuses/Orbits: Paranasal sinuses and mastoid air cells are clear. The orbits are unremarkable. Other: None. IMPRESSION: No acute intracranial abnormality. Electronically Signed   By: Iven Finn M.D.   On: 03/29/2022 19:48        Scheduled Meds:  amiodarone  200 mg Oral BID   aspirin EC  81 mg Oral Daily   finasteride  5 mg Oral Daily   fluticasone furoate-vilanterol  1 puff Inhalation Daily   heparin  5,000 Units Subcutaneous Q8H   mexiletine  150 mg Oral Q12H   midodrine  2.5 mg Oral TID WC   pantoprazole  80 mg Oral Daily   rosuvastatin  20 mg Oral QHS   tamsulosin  0.8 mg Oral Daily   venlafaxine XR  150 mg Oral Daily   Continuous Infusions:   LOS: 0 days    Time spent:5 min    Lucely Leard, Geraldo Docker, MD [facility]   If 7PM-7AM, please contact night-coverage 03/31/2022, 8:58 AM

## 2022-04-01 DIAGNOSIS — I509 Heart failure, unspecified: Secondary | ICD-10-CM | POA: Diagnosis not present

## 2022-04-01 DIAGNOSIS — J9611 Chronic respiratory failure with hypoxia: Secondary | ICD-10-CM | POA: Diagnosis not present

## 2022-04-01 DIAGNOSIS — E860 Dehydration: Secondary | ICD-10-CM | POA: Diagnosis not present

## 2022-04-01 DIAGNOSIS — R531 Weakness: Secondary | ICD-10-CM | POA: Diagnosis not present

## 2022-04-01 LAB — COMPREHENSIVE METABOLIC PANEL
ALT: 18 U/L (ref 0–44)
AST: 21 U/L (ref 15–41)
Albumin: 3.3 g/dL — ABNORMAL LOW (ref 3.5–5.0)
Alkaline Phosphatase: 50 U/L (ref 38–126)
Anion gap: 6 (ref 5–15)
BUN: 12 mg/dL (ref 8–23)
CO2: 25 mmol/L (ref 22–32)
Calcium: 8.4 mg/dL — ABNORMAL LOW (ref 8.9–10.3)
Chloride: 110 mmol/L (ref 98–111)
Creatinine, Ser: 0.91 mg/dL (ref 0.61–1.24)
GFR, Estimated: >60 mL/min (ref >60)
Glucose, Bld: 96 mg/dL (ref 70–99)
Potassium: 4.2 mmol/L (ref 3.5–5.1)
Sodium: 141 mmol/L (ref 135–145)
Total Bilirubin: 1 mg/dL (ref 0.3–1.2)
Total Protein: 5.7 g/dL — ABNORMAL LOW (ref 6.5–8.1)

## 2022-04-01 LAB — CBC WITH DIFFERENTIAL/PLATELET
Abs Immature Granulocytes: 0.03 10*3/uL (ref 0.00–0.07)
Basophils Absolute: 0 10*3/uL (ref 0.0–0.1)
Basophils Relative: 1 %
Eosinophils Absolute: 0.1 10*3/uL (ref 0.0–0.5)
Eosinophils Relative: 3 %
HCT: 39.1 % (ref 39.0–52.0)
Hemoglobin: 11.7 g/dL — ABNORMAL LOW (ref 13.0–17.0)
Immature Granulocytes: 1 %
Lymphocytes Relative: 18 %
Lymphs Abs: 0.6 10*3/uL — ABNORMAL LOW (ref 0.7–4.0)
MCH: 27.5 pg (ref 26.0–34.0)
MCHC: 29.9 g/dL — ABNORMAL LOW (ref 30.0–36.0)
MCV: 92 fL (ref 80.0–100.0)
Monocytes Absolute: 0.5 10*3/uL (ref 0.1–1.0)
Monocytes Relative: 14 %
Neutro Abs: 2.2 10*3/uL (ref 1.7–7.7)
Neutrophils Relative %: 63 %
Platelets: 130 10*3/uL — ABNORMAL LOW (ref 150–400)
RBC: 4.25 MIL/uL (ref 4.22–5.81)
RDW: 19.2 % — ABNORMAL HIGH (ref 11.5–15.5)
WBC: 3.5 10*3/uL — ABNORMAL LOW (ref 4.0–10.5)
nRBC: 0 % (ref 0.0–0.2)

## 2022-04-01 LAB — MAGNESIUM: Magnesium: 1.9 mg/dL (ref 1.7–2.4)

## 2022-04-01 LAB — PHOSPHORUS: Phosphorus: 3.5 mg/dL (ref 2.5–4.6)

## 2022-04-01 NOTE — Progress Notes (Signed)
PROGRESS NOTE    Ryan Rodgers  [id number] DOB: Feb 27, 1951 DOA: 03/29/2022 PCP: Beckie Salts, MD     Brief Narrative:  71 year old WM PMHx Chronic Systolic CHF(EF of 20 to 123456), S/P ICD, HTN, CAD, ventricular tachycardia, chronic opiate and benzodiazepine use, Drug abuse, chronic back pain,  reflux, coronary disease, COPD, Chronic respiratory failure with hypoxia on home O2, noncompliance with medication  Presented to the [place] ER.   Please note the patient receives ALL of his medical care at [facility] in [place].  He has never been to [facility] outpatient practice or in a [facility] hospital before.   He was last hospitalized December 23, 2021 at [facility] for syncope and ventricular tachycardia.  He was discharged on home oxygen.  Apparently he was not wearing it and his sister sent the machine back home.  During this hospitalization, patient noted to be orthostatic.  Patient has a history of orthostasis.  He presented to the [place] ER today hypoxic.  Apparently ER cannot arrange for him to get oxygen.  They also declined to send the patient back to [facility] where he normally gets all of his medical care.   Patient transferred to [facility].   Reportedly EDP talked with the patient's sister.  Patient has been abusing his hydrocodone and Xanax.   Patient is sleeping and does not want to wake up to answer any of my questions.     Subjective: 6/12 afebrile overnight, A/O x4 resting comfortably in bed    Assessment & Plan: Covid vaccination;   Principal Problem:   Dehydration Active Problems:   ICD (implantable cardioverter-defibrillator) in place -  Medtronic pacemaker.  Model EVERA MRI XT DR [id number].  Serial number [id number]    Chronic airway obstruction (HCC)   Chronic systolic CHF (congestive heart failure) ([place]) - by echo 02-23-2022 LVEF 20-25%   Orthostatic hypotension   Chronic respiratory  failure with hypoxia (HCC)   Hypokalemia   Drug-seeking behavior  Dehydration -See orthostatic hypotension    Hypokalemia -Potassium goal > 4 - 6/11 K-Dur 50 mEq    Chronic respiratory failure with hypoxia ([place]) According to his discharge summary he was sent home on home oxygen.  EDP got the history from the patient's sister that he was not using his oxygen and she sent the machine back to the respiratory company.  Will need case management to get another home oxygen concentrator for him.  Patient need to follow-up with his [facility] primary care physician. -Titrate O2 to maintain SPO2 89 to 95% -6/11 ambulatory SPO2 SATURATION QUALIFICATIONS: (This note is used to comply with regulatory documentation for home oxygen) Patient Saturations on Room Air at Rest = 90% Patient Saturations on Room Air while Ambulating = 85% Patient Saturations on 2 Liters of oxygen while Ambulating = 93% Please briefly explain why patient needs home oxygen: patient desats with exertion when off oxygen  -Patient meets criteria for home O2 - 2 L O2 via [place], titrate to maintain SPO2> 92% - Provide patient with Inogen home O2 concentrator -6/11 have requested home O2   Chronic airway obstruction ([place]) Followed by [facility] internal medicine clinic   Orthostatic Hypotension -Chronic problem for him.  He was orthostatic during his last hospital admission at [facility].  He received some IV fluids there.  -6/11 continue knee-high TED hose -6/11 orthostatic vitals every shift: Patient extremely orthostatic today. -6/11 Albumin  50 g x 1 -6/11 0.45% saline@ 62ml/hr -6/12 patient remains orthostatic -6/12 increase Midodrine 10 mg TID   Chronic systolic CHF (HCC) - by echo 02-23-2022 LVEF 20-25% -Actively followed by cardiology at [facility].   [place] (hold) - ACE inhibitor ("hold) - Amiodarone 200 mg BID -Mexiletine 150 mg BID -Strict in and out +1.5 L -  Daily weight Filed Weights   03/29/22 1907 03/31/22 0500 04/01/22 0500  Weight: 75.4 kg 71.3 kg 73 kg        ICD (implantable cardioverter-defibrillator) in place  -  Medtronic pacemaker.  Model EVERA MRI XT DR [id number].  Serial number [id number]  -Follows with [facility] cardiology in [place].  Per [facility] records, patient has a Medtronic pacemaker.  Model EVERA MRI XT DR [id number].  Serial number [id number]    Drug abuse/EtOH abuse - 6/9 urine rapid drug screen positive benzodiazepine, opiates, THC -6/10 EtOH<10          Mobility Assessment (last 72 hours)     Mobility Assessment     Row Name 04/01/22 0834 03/31/22 0742 03/30/22 1542 03/30/22 0600 03/30/22 0200   Does patient have an order for bedrest or is patient medically unstable No - Continue assessment No - Continue assessment No - Continue assessment No - Continue assessment No - Continue assessment   What is the highest level of mobility based on the progressive mobility assessment? Level 5 (Walks with assist in room/hall) - Balance while stepping forward/back and can walk in room with assist - Complete Level 5 (Walks with assist in room/hall) - Balance while stepping forward/back and can walk in room with assist - Complete Level 4 (Walks with assist in room) - Balance while marching in place and cannot step forward and back - Complete Level 3 (Stands with assist) - Balance while standing  and cannot march in place Level 1 (Bedfast) - Unable to balance while sitting on edge of bed   Is the above level different from baseline mobility prior to current illness? -- Yes - Recommend PT order -- Yes - Recommend PT order Yes - Recommend PT order              Interdisciplinary Goals of Care Family Meeting   Date carried out: 04/01/2022  Location of the meeting:   Member's involved:   Durable Power of Attorney or Loss adjuster, chartered:     Discussion: We discussed goals of care for [employer] .     Code status:   Disposition:   Time spent for the meeting:     Madalene Mickler J, MD  04/01/2022, 1:59 PM         DVT prophylaxis: Heparin Code Status: Full Family Communication:  Status is: Inpatient    Dispo: The patient is from: Home              Anticipated d/c is to:??              Anticipated d/c date is: 2> Days              Patient currently is not medically stable to d/c.      Consultants:    Procedures/Significant Events:    I have personally reviewed and interpreted all radiology studies and my findings are as above.  VENTILATOR SETTINGS: Room air 6/12 SPO2 79%  Cultures   Antimicrobials:    Devices    LINES / TUBES:      Continuous Infusions:  sodium chloride 50 mL/hr at 03/31/22 1629     Objective: Vitals:   04/01/22 0601 04/01/22 0603 04/01/22 0915 04/01/22 1354  BP: (!) 88/64 102/71 (!) 136/101 120/63  Pulse: 79 75  (!) 103  Resp:   20 18  Temp:    98.2 F (36.8 C)  TempSrc:    Oral  SpO2: (!) 89% 91% 97% (!) 79%  Weight:      Height:        Intake/Output Summary (Last 24 hours) at 04/01/2022 1359 Last data filed at 04/01/2022 1000 Gross per 24 hour  Intake 588.48 ml  Output 475 ml  Net 113.48 ml    Filed Weights   03/29/22 1907 03/31/22 0500 04/01/22 0500  Weight: 75.4 kg 71.3 kg 73 kg    Physical Exam:  General: A/O x4 No acute respiratory distress, cachectic Eyes: negative scleral hemorrhage, negative anisocoria, negative icterus ENT: Negative Runny nose, negative gingival bleeding, Neck:  Negative scars, masses, torticollis, lymphadenopathy, JVD Lungs: Clear to auscultation bilaterally without wheezes or crackles Cardiovascular: Regular rate and rhythm without murmur gallop or rub normal S1 and S2 Abdomen: negative abdominal pain, nondistended, positive soft, bowel sounds, no rebound, no ascites, no appreciable mass Extremities: No significant cyanosis, clubbing, or edema bilateral lower  extremities Skin: Negative rashes, lesions, ulcers Psychiatric:  Negative depression, negative anxiety, negative fatigue, negative mania  Central nervous system:  Cranial nerves II through XII intact, tongue/uvula midline, all extremities muscle strength 5/5, sensation intact throughout,  negative dysarthria, negative expressive aphasia, negative receptive aphasia. .     Data Reviewed: Care during the described time interval was provided by me .  I have reviewed this patient's available data, including medical history, events of note, physical examination, and all test results as part of my evaluation.  CBC: Recent Labs  Lab 03/29/22 1910 03/29/22 2027 03/30/22 0741 03/31/22 0356 04/01/22 0338  WBC 6.2  --  5.4 4.3 3.5*  NEUTROABS 4.4  --   --  2.7 2.2  HGB 14.8 13.9 14.3 12.9* 11.7*  HCT 46.4 41.0 44.9 41.8 39.1  MCV 86.4  --  88.6 89.7 92.0  PLT 149*  --  137* 131* 130*    Basic Metabolic Panel: Recent Labs  Lab 03/29/22 1910 03/29/22 2027 03/30/22 0741 03/30/22 0905 03/31/22 0356 04/01/22 0338  NA 139 140  --  142 146* 141  K 3.0* 2.9*  --  3.0* 3.6 4.2  CL 104  --   --  106 111 110  CO2 27  --   --  29 28 25   GLUCOSE 122*  --   --  95 95 96  BUN 16  --   --  14 15 12   CREATININE 1.49*  --  1.04 1.13 1.06 0.91  CALCIUM 8.5*  --   --  8.1* 8.6* 8.4*  MG  --   --   --  1.8 2.0 1.9  PHOS  --   --   --  3.7 3.3 3.5    GFR: Estimated Creatinine Clearance: 71 mL/min (by C-G formula based on SCr of 0.91 mg/dL). Liver Function Tests: Recent Labs  Lab 03/29/22 1910 03/30/22 0905 03/31/22 0356 04/01/22 0338  AST 26 23 18 21   ALT 25 24 23 18   ALKPHOS 75 61 60 50  BILITOT 0.8 1.0 1.1 1.0  PROT 6.2* 5.7* 5.6* 5.7*  ALBUMIN 3.3* 3.0* 2.9* 3.3*    No results for input(s): "LIPASE", "AMYLASE" in the last 168 hours. Recent  Labs  Lab 03/29/22 2018  AMMONIA 34    Coagulation Profile: Recent Labs  Lab 03/29/22 1910  INR 1.1    Cardiac Enzymes: No results  for input(s): "CKTOTAL", "CKMB", "CKMBINDEX", "TROPONINI" in the last 168 hours. BNP (last 3 results) No results for input(s): "PROBNP" in the last 8760 hours. HbA1C: No results for input(s): "HGBA1C" in the last 72 hours. CBG: Recent Labs  Lab 03/29/22 1901  GLUCAP 149*    Lipid Profile: No results for input(s): "CHOL", "HDL", "LDLCALC", "TRIG", "CHOLHDL", "LDLDIRECT" in the last 72 hours. Thyroid Function Tests: No results for input(s): "TSH", "T4TOTAL", "FREET4", "T3FREE", "THYROIDAB" in the last 72 hours. Anemia Panel: No results for input(s): "VITAMINB12", "FOLATE", "FERRITIN", "TIBC", "IRON", "RETICCTPCT" in the last 72 hours. Sepsis Labs: No results for input(s): "PROCALCITON", "LATICACIDVEN" in the last 168 hours.  No results found for this or any previous visit (from the past 240 hour(s)).       Radiology Studies: No results found.      Scheduled Meds:  amiodarone  200 mg Oral BID   aspirin EC  81 mg Oral Daily   finasteride  5 mg Oral Daily   fluticasone furoate-vilanterol  1 puff Inhalation Daily   heparin  5,000 Units Subcutaneous Q8H   mexiletine  150 mg Oral Q12H   midodrine  5 mg Oral TID WC   pantoprazole  80 mg Oral Daily   rosuvastatin  20 mg Oral QHS   tamsulosin  0.8 mg Oral Daily   venlafaxine XR  150 mg Oral Daily   Continuous Infusions:  sodium chloride 50 mL/hr at 03/31/22 1629     LOS: 0 days    Time spent:5 min    Dreama Kuna, Geraldo Docker, MD [facility]   If 7PM-7AM, please contact night-coverage 04/01/2022, 1:59 PM

## 2022-04-02 ENCOUNTER — Encounter (HOSPITAL_COMMUNITY): Payer: Self-pay

## 2022-04-02 DIAGNOSIS — I2510 Atherosclerotic heart disease of native coronary artery without angina pectoris: Secondary | ICD-10-CM | POA: Diagnosis present

## 2022-04-02 DIAGNOSIS — R296 Repeated falls: Secondary | ICD-10-CM | POA: Diagnosis present

## 2022-04-02 DIAGNOSIS — I509 Heart failure, unspecified: Secondary | ICD-10-CM | POA: Diagnosis not present

## 2022-04-02 DIAGNOSIS — Z882 Allergy status to sulfonamides status: Secondary | ICD-10-CM | POA: Diagnosis not present

## 2022-04-02 DIAGNOSIS — J9611 Chronic respiratory failure with hypoxia: Secondary | ICD-10-CM | POA: Diagnosis not present

## 2022-04-02 DIAGNOSIS — Z87891 Personal history of nicotine dependence: Secondary | ICD-10-CM | POA: Diagnosis not present

## 2022-04-02 DIAGNOSIS — F1110 Opioid abuse, uncomplicated: Secondary | ICD-10-CM | POA: Diagnosis present

## 2022-04-02 DIAGNOSIS — Z95810 Presence of automatic (implantable) cardiac defibrillator: Secondary | ICD-10-CM | POA: Diagnosis not present

## 2022-04-02 DIAGNOSIS — N400 Enlarged prostate without lower urinary tract symptoms: Secondary | ICD-10-CM | POA: Diagnosis present

## 2022-04-02 DIAGNOSIS — Z765 Malingerer [conscious simulation]: Secondary | ICD-10-CM | POA: Diagnosis not present

## 2022-04-02 DIAGNOSIS — M545 Low back pain, unspecified: Secondary | ICD-10-CM | POA: Diagnosis present

## 2022-04-02 DIAGNOSIS — I472 Ventricular tachycardia, unspecified: Secondary | ICD-10-CM | POA: Diagnosis present

## 2022-04-02 DIAGNOSIS — Z91030 Bee allergy status: Secondary | ICD-10-CM | POA: Diagnosis not present

## 2022-04-02 DIAGNOSIS — I5022 Chronic systolic (congestive) heart failure: Secondary | ICD-10-CM | POA: Diagnosis present

## 2022-04-02 DIAGNOSIS — I951 Orthostatic hypotension: Secondary | ICD-10-CM | POA: Diagnosis present

## 2022-04-02 DIAGNOSIS — Z888 Allergy status to other drugs, medicaments and biological substances status: Secondary | ICD-10-CM | POA: Diagnosis not present

## 2022-04-02 DIAGNOSIS — E876 Hypokalemia: Secondary | ICD-10-CM | POA: Diagnosis present

## 2022-04-02 DIAGNOSIS — G8929 Other chronic pain: Secondary | ICD-10-CM | POA: Diagnosis present

## 2022-04-02 DIAGNOSIS — R531 Weakness: Secondary | ICD-10-CM | POA: Diagnosis not present

## 2022-04-02 DIAGNOSIS — I110 Hypertensive heart disease with heart failure: Secondary | ICD-10-CM | POA: Diagnosis present

## 2022-04-02 DIAGNOSIS — E860 Dehydration: Secondary | ICD-10-CM | POA: Diagnosis not present

## 2022-04-02 DIAGNOSIS — Z91041 Radiographic dye allergy status: Secondary | ICD-10-CM | POA: Diagnosis not present

## 2022-04-02 DIAGNOSIS — K219 Gastro-esophageal reflux disease without esophagitis: Secondary | ICD-10-CM | POA: Diagnosis present

## 2022-04-02 DIAGNOSIS — Z79891 Long term (current) use of opiate analgesic: Secondary | ICD-10-CM | POA: Diagnosis not present

## 2022-04-02 DIAGNOSIS — Z9981 Dependence on supplemental oxygen: Secondary | ICD-10-CM | POA: Diagnosis not present

## 2022-04-02 DIAGNOSIS — F1010 Alcohol abuse, uncomplicated: Secondary | ICD-10-CM | POA: Diagnosis present

## 2022-04-02 LAB — CBC WITH DIFFERENTIAL/PLATELET
Abs Immature Granulocytes: 0.06 10*3/uL (ref 0.00–0.07)
Basophils Absolute: 0 10*3/uL (ref 0.0–0.1)
Basophils Relative: 1 %
Eosinophils Absolute: 0.1 10*3/uL (ref 0.0–0.5)
Eosinophils Relative: 2 %
HCT: 40.3 % (ref 39.0–52.0)
Hemoglobin: 12.3 g/dL — ABNORMAL LOW (ref 13.0–17.0)
Immature Granulocytes: 2 %
Lymphocytes Relative: 17 %
Lymphs Abs: 0.6 10*3/uL — ABNORMAL LOW (ref 0.7–4.0)
MCH: 27.9 pg (ref 26.0–34.0)
MCHC: 30.5 g/dL (ref 30.0–36.0)
MCV: 91.4 fL (ref 80.0–100.0)
Monocytes Absolute: 0.4 10*3/uL (ref 0.1–1.0)
Monocytes Relative: 11 %
Neutro Abs: 2.5 10*3/uL (ref 1.7–7.7)
Neutrophils Relative %: 67 %
Platelets: 117 10*3/uL — ABNORMAL LOW (ref 150–400)
RBC: 4.41 MIL/uL (ref 4.22–5.81)
RDW: 18.6 % — ABNORMAL HIGH (ref 11.5–15.5)
WBC: 3.6 10*3/uL — ABNORMAL LOW (ref 4.0–10.5)
nRBC: 0 % (ref 0.0–0.2)

## 2022-04-02 LAB — COMPREHENSIVE METABOLIC PANEL
ALT: 20 U/L (ref 0–44)
AST: 16 U/L (ref 15–41)
Albumin: 3.2 g/dL — ABNORMAL LOW (ref 3.5–5.0)
Alkaline Phosphatase: 59 U/L (ref 38–126)
Anion gap: 6 (ref 5–15)
BUN: 17 mg/dL (ref 8–23)
CO2: 26 mmol/L (ref 22–32)
Calcium: 8.9 mg/dL (ref 8.9–10.3)
Chloride: 110 mmol/L (ref 98–111)
Creatinine, Ser: 0.85 mg/dL (ref 0.61–1.24)
GFR, Estimated: >60 mL/min (ref >60)
Glucose, Bld: 101 mg/dL — ABNORMAL HIGH (ref 70–99)
Potassium: 3.7 mmol/L (ref 3.5–5.1)
Sodium: 142 mmol/L (ref 135–145)
Total Bilirubin: 0.7 mg/dL (ref 0.3–1.2)
Total Protein: 5.6 g/dL — ABNORMAL LOW (ref 6.5–8.1)

## 2022-04-02 LAB — MAGNESIUM: Magnesium: 1.8 mg/dL (ref 1.7–2.4)

## 2022-04-02 LAB — PHOSPHORUS: Phosphorus: 3.8 mg/dL (ref 2.5–4.6)

## 2022-04-02 MED ORDER — MIDODRINE HCL 5 MG PO TABS
10.0000 mg | ORAL_TABLET | Freq: Three times a day (TID) | ORAL | Status: DC
  Administered 2022-04-02 – 2022-04-03 (×4): 10 mg via ORAL
  Filled 2022-04-02 (×4): qty 2

## 2022-04-02 NOTE — TOC Progression Note (Signed)
Transition of Care [facility]) - Progression Note    Patient Details  Name: Ryan Rodgers MRN: [id number] Date of Birth: June 05, 1951  Transition of Care [facility]) CM/SW Contact  Geni Bers, RN Phone Number: 04/02/2022, 3:39 PM  Clinical Narrative:     Pt use [employer] for DME. Referral given to in house rep.   Expected Discharge Plan: Home w Home Health Services Barriers to Discharge: No Barriers Identified  Expected Discharge Plan and Services Expected Discharge Plan: Home w Home Health Services     Post Acute Care Choice: Home Health Living arrangements for the past 2 months: Single Family Home                 DME Arranged: Oxygen DME Agency: [employer] Date DME Agency Contacted: 04/02/22 Time DME Agency Contacted: [phone] Representative spoke with at DME Agency: Duwayne Heck             Social Determinants of Health (SDOH) Interventions    Readmission Risk Interventions     No data to display

## 2022-04-02 NOTE — Progress Notes (Signed)
SATURATION QUALIFICATIONS: (This note is used to comply with regulatory documentation for home oxygen)  Patient Saturations on Room Air at Rest = 92%  Patient Saturations on Room Air while Ambulating = 86%  Patient Saturations on 3 Liters of oxygen while Ambulating = 90%  Please briefly explain why patient needs home oxygen:

## 2022-04-02 NOTE — Progress Notes (Signed)
PROGRESS NOTE    Ryan Rodgers  [id number] DOB: [birth year] DOA: 03/29/2022 PCP: Beckie Salts, MD     Brief Narrative:  71 year old WM PMHx Chronic Systolic CHF(EF of 20 to 123456), S/P ICD, HTN, CAD, ventricular tachycardia, chronic opiate and benzodiazepine use, Drug abuse, chronic back pain,  reflux, coronary disease, COPD, Chronic respiratory failure with hypoxia on home O2, noncompliance with medication  Presented to the [place] ER.   Please note the patient receives ALL of his medical care at [facility] in [place].  He has never been to [facility] outpatient practice or in a [facility] hospital before.   He was last hospitalized December 23, 2021 at [facility] for syncope and ventricular tachycardia.  He was discharged on home oxygen.  Apparently he was not wearing it and his sister sent the machine back home.  During this hospitalization, patient noted to be orthostatic.  Patient has a history of orthostasis.  He presented to the [place] ER today hypoxic.  Apparently ER cannot arrange for him to get oxygen.  They also declined to send the patient back to [facility] where he normally gets all of his medical care.   Patient transferred to [facility].   Reportedly EDP talked with the patient's sister.  Patient has been abusing his hydrocodone and Xanax.   Patient is sleeping and does not want to wake up to answer any of my questions.     Subjective: 6/13 afebrile overnight A/O x4.  Patient states although significant orthostasis when moving from sitting to standing he was asymptomatic.    Assessment & Plan: Covid vaccination;   Principal Problem:   Dehydration Active Problems:   ICD (implantable cardioverter-defibrillator) in place -  Medtronic pacemaker.  Model EVERA MRI XT DR [id number].  Serial number [id number]    Chronic airway obstruction (HCC)   Chronic systolic CHF (congestive heart failure) ([place]) - by  echo 02-23-2022 LVEF 20-25%   Orthostatic hypotension   Chronic respiratory failure with hypoxia (HCC)   Hypokalemia   Drug-seeking behavior  Dehydration -See orthostatic hypotension    Hypokalemia -Potassium goal > 4 - 6/11 K-Dur 50 mEq    Chronic respiratory failure with hypoxia ([place]) According to his discharge summary he was sent home on home oxygen.  EDP got the history from the patient's sister that he was not using his oxygen and she sent the machine back to the respiratory company.  Will need case management to get another home oxygen concentrator for him.  Patient need to follow-up with his [facility] primary care physician. -Titrate O2 to maintain SPO2 89 to 95% -6/11 ambulatory SPO2 SATURATION QUALIFICATIONS: (This note is used to comply with regulatory documentation for home oxygen) Patient Saturations on Room Air at Rest = 90% Patient Saturations on Room Air while Ambulating = 85% Patient Saturations on 2 Liters of oxygen while Ambulating = 93% Please briefly explain why patient needs home oxygen: patient desats with exertion when off oxygen  -Patient meets criteria for home O2 - 2 L O2 via [place], titrate to maintain SPO2> 92% - Provide patient with Inogen home O2 concentrator -6/11 have requested home O2   Chronic airway obstruction ([place]) Followed by [facility] internal medicine clinic   Orthostatic Hypotension -Chronic problem for him.  He was orthostatic during his last hospital admission at [facility].  He received some IV fluids there.  -6/11 continue knee-high TED hose -  6/11 orthostatic vitals every shift: Patient extremely orthostatic today. -6/11 Albumin 50 g x 1 -6/11 0.45% saline@ 68ml/hr -6/12 increase Midodrine 10 mg TID -6/13 patient remains orthostatic: Improved but still significant orthostasis when moving from sitting to standing (per patient asymptomatic).  May have to accept some orthostatic hypotension.   Chronic systolic  CHF ([place]) - by echo 02-23-2022 LVEF 20-25% -Actively followed by cardiology at [facility].   [place] (hold) - ACE inhibitor ("hold) - Amiodarone 200 mg BID -Mexiletine 150 mg BID -Strict in and out +973.59ml - Daily weight Filed Weights   03/31/22 0500 04/01/22 0500 04/02/22 0500  Weight: 71.3 kg 73 kg 72.7 kg      ICD (implantable cardioverter-defibrillator) in place  -  Medtronic pacemaker.  Model EVERA MRI XT DR [id number].  Serial number [id number]  -Follows with [facility] cardiology in [place].  Per [facility] records, patient has a Medtronic pacemaker.  Model EVERA MRI XT DR [id number].  Serial number [id number]    Drug abuse/EtOH abuse - 6/9 urine rapid drug screen positive benzodiazepine, opiates, THC -6/10 EtOH<10          Mobility Assessment (last 72 hours)     Mobility Assessment     Row Name 04/02/22 0845 04/01/22 0834 03/31/22 0742 03/30/22 1542     Does patient have an order for bedrest or is patient medically unstable No - Continue assessment No - Continue assessment No - Continue assessment No - Continue assessment    What is the highest level of mobility based on the progressive mobility assessment? Level 5 (Walks with assist in room/hall) - Balance while stepping forward/back and can walk in room with assist - Complete Level 5 (Walks with assist in room/hall) - Balance while stepping forward/back and can walk in room with assist - Complete Level 5 (Walks with assist in room/hall) - Balance while stepping forward/back and can walk in room with assist - Complete Level 4 (Walks with assist in room) - Balance while marching in place and cannot step forward and back - Complete    Is the above level different from baseline mobility prior to current illness? -- -- Yes - Recommend PT order --               Interdisciplinary Goals of Care Family Meeting   Date carried out: 04/02/2022  Location of the meeting:   Member's involved:    Durable Power of Tour manager:     Discussion: We discussed goals of care for [employer] .    Code status:   Disposition:   Time spent for the meeting:     Sufyan Meidinger J, MD  04/02/2022, 2:50 PM         DVT prophylaxis: Heparin Code Status: Full Family Communication:  Status is: Inpatient    Dispo: The patient is from: Home              Anticipated d/c is to:??              Anticipated d/c date is: 2> Days              Patient currently is not medically stable to d/c.      Consultants:    Procedures/Significant Events:    I have personally reviewed and interpreted all radiology studies and my findings are as above.  VENTILATOR SETTINGS: Room air 6/13 SPO2 91%  Cultures   Antimicrobials:    Devices  LINES / TUBES:      Continuous Infusions:  sodium chloride 50 mL/hr at 04/02/22 [id number]     Objective: Vitals:   04/02/22 0500 04/02/22 0935 04/02/22 0949 04/02/22 1302  BP:  129/73  112/64  Pulse:    77  Resp:      Temp:    98.4 F (36.9 C)  TempSrc:    Oral  SpO2: 92%  92% 91%  Weight: 72.7 kg     Height:        Intake/Output Summary (Last 24 hours) at 04/02/2022 1450 Last data filed at 04/02/2022 1300 Gross per 24 hour  Intake 1300 ml  Output 1900 ml  Net -600 ml    Filed Weights   03/31/22 0500 04/01/22 0500 04/02/22 0500  Weight: 71.3 kg 73 kg 72.7 kg    Physical Exam:  General: A/O x4 No acute respiratory distress, cachectic Eyes: negative scleral hemorrhage, negative anisocoria, negative icterus ENT: Negative Runny nose, negative gingival bleeding, Neck:  Negative scars, masses, torticollis, lymphadenopathy, JVD Lungs: Clear to auscultation bilaterally without wheezes or crackles Cardiovascular: Regular rate and rhythm without murmur gallop or rub normal S1 and S2 Abdomen: negative abdominal pain, nondistended, positive soft, bowel sounds, no rebound, no ascites, no appreciable  mass Extremities: No significant cyanosis, clubbing, or edema bilateral lower extremities Skin: Negative rashes, lesions, ulcers Psychiatric:  Negative depression, negative anxiety, negative fatigue, negative mania  Central nervous system:  Cranial nerves II through XII intact, tongue/uvula midline, all extremities muscle strength 5/5, sensation intact throughout,  negative dysarthria, negative expressive aphasia, negative receptive aphasia. .     Data Reviewed: Care during the described time interval was provided by me .  I have reviewed this patient's available data, including medical history, events of note, physical examination, and all test results as part of my evaluation.  CBC: Recent Labs  Lab 03/29/22 1910 03/29/22 2027 03/30/22 0741 03/31/22 0356 04/01/22 0338 04/02/22 0351  WBC 6.2  --  5.4 4.3 3.5* 3.6*  NEUTROABS 4.4  --   --  2.7 2.2 2.5  HGB 14.8 13.9 14.3 12.9* 11.7* 12.3*  HCT 46.4 41.0 44.9 41.8 39.1 40.3  MCV 86.4  --  88.6 89.7 92.0 91.4  PLT 149*  --  137* 131* 130* 117*    Basic Metabolic Panel: Recent Labs  Lab 03/29/22 1910 03/29/22 2027 03/30/22 0741 03/30/22 0905 03/31/22 0356 04/01/22 0338 04/02/22 0351  NA 139 140  --  142 146* 141 142  K 3.0* 2.9*  --  3.0* 3.6 4.2 3.7  CL 104  --   --  106 111 110 110  CO2 27  --   --  29 28 25 26   GLUCOSE 122*  --   --  95 95 96 101*  BUN 16  --   --  14 15 12 17   CREATININE 1.49*  --  1.04 1.13 1.06 0.91 0.85  CALCIUM 8.5*  --   --  8.1* 8.6* 8.4* 8.9  MG  --   --   --  1.8 2.0 1.9 1.8  PHOS  --   --   --  3.7 3.3 3.5 3.8    GFR: Estimated Creatinine Clearance: 76 mL/min (by C-G formula based on SCr of 0.85 mg/dL). Liver Function Tests: Recent Labs  Lab 03/29/22 1910 03/30/22 0905 03/31/22 0356 04/01/22 0338 04/02/22 0351  AST 26 23 18 21 16   ALT 25 24 23 18 20   ALKPHOS 75 61 60 50 59  BILITOT 0.8 1.0 1.1 1.0 0.7  PROT 6.2* 5.7* 5.6* 5.7* 5.6*  ALBUMIN 3.3* 3.0* 2.9* 3.3* 3.2*    No  results for input(s): "LIPASE", "AMYLASE" in the last 168 hours. Recent Labs  Lab 03/29/22 2018  AMMONIA 34    Coagulation Profile: Recent Labs  Lab 03/29/22 1910  INR 1.1    Cardiac Enzymes: No results for input(s): "CKTOTAL", "CKMB", "CKMBINDEX", "TROPONINI" in the last 168 hours. BNP (last 3 results) No results for input(s): "PROBNP" in the last 8760 hours. HbA1C: No results for input(s): "HGBA1C" in the last 72 hours. CBG: Recent Labs  Lab 03/29/22 1901  GLUCAP 149*    Lipid Profile: No results for input(s): "CHOL", "HDL", "LDLCALC", "TRIG", "CHOLHDL", "LDLDIRECT" in the last 72 hours. Thyroid Function Tests: No results for input(s): "TSH", "T4TOTAL", "FREET4", "T3FREE", "THYROIDAB" in the last 72 hours. Anemia Panel: No results for input(s): "VITAMINB12", "FOLATE", "FERRITIN", "TIBC", "IRON", "RETICCTPCT" in the last 72 hours. Sepsis Labs: No results for input(s): "PROCALCITON", "LATICACIDVEN" in the last 168 hours.  No results found for this or any previous visit (from the past 240 hour(s)).       Radiology Studies: No results found.      Scheduled Meds:  amiodarone  200 mg Oral BID   aspirin EC  81 mg Oral Daily   finasteride  5 mg Oral Daily   fluticasone furoate-vilanterol  1 puff Inhalation Daily   heparin  5,000 Units Subcutaneous Q8H   mexiletine  150 mg Oral Q12H   midodrine  10 mg Oral TID WC   pantoprazole  80 mg Oral Daily   rosuvastatin  20 mg Oral QHS   tamsulosin  0.8 mg Oral Daily   venlafaxine XR  150 mg Oral Daily   Continuous Infusions:  sodium chloride 50 mL/hr at 04/02/22 0938     LOS: 0 days    Time spent:5 min    Drevin Ortner, Geraldo Docker, MD [facility]   If 7PM-7AM, please contact night-coverage 04/02/2022, 2:50 PM

## 2022-04-02 NOTE — Care Management Obs Status (Signed)
MEDICARE OBSERVATION STATUS NOTIFICATION   Patient Details  Name: Ryan Rodgers MRN: [id number] Date of Birth: Sep 26, 1951   Medicare Observation Status Notification Given:  Yes    Geni Bers, RN 04/02/2022, 10:10 AM

## 2022-04-03 DIAGNOSIS — E860 Dehydration: Secondary | ICD-10-CM | POA: Diagnosis not present

## 2022-04-03 LAB — CBC WITH DIFFERENTIAL/PLATELET
Abs Immature Granulocytes: 0.07 10*3/uL (ref 0.00–0.07)
Basophils Absolute: 0 10*3/uL (ref 0.0–0.1)
Basophils Relative: 1 %
Eosinophils Absolute: 0.1 10*3/uL (ref 0.0–0.5)
Eosinophils Relative: 2 %
HCT: 39.2 % (ref 39.0–52.0)
Hemoglobin: 12.2 g/dL — ABNORMAL LOW (ref 13.0–17.0)
Immature Granulocytes: 1 %
Lymphocytes Relative: 14 %
Lymphs Abs: 0.8 10*3/uL (ref 0.7–4.0)
MCH: 27.9 pg (ref 26.0–34.0)
MCHC: 31.1 g/dL (ref 30.0–36.0)
MCV: 89.5 fL (ref 80.0–100.0)
Monocytes Absolute: 0.7 10*3/uL (ref 0.1–1.0)
Monocytes Relative: 13 %
Neutro Abs: 3.7 10*3/uL (ref 1.7–7.7)
Neutrophils Relative %: 69 %
Platelets: 125 10*3/uL — ABNORMAL LOW (ref 150–400)
RBC: 4.38 MIL/uL (ref 4.22–5.81)
RDW: 18.4 % — ABNORMAL HIGH (ref 11.5–15.5)
WBC: 5.3 10*3/uL (ref 4.0–10.5)
nRBC: 0 % (ref 0.0–0.2)

## 2022-04-03 LAB — COMPREHENSIVE METABOLIC PANEL
ALT: 20 U/L (ref 0–44)
AST: 15 U/L (ref 15–41)
Albumin: 3.1 g/dL — ABNORMAL LOW (ref 3.5–5.0)
Alkaline Phosphatase: 59 U/L (ref 38–126)
Anion gap: 5 (ref 5–15)
BUN: 16 mg/dL (ref 8–23)
CO2: 27 mmol/L (ref 22–32)
Calcium: 8.6 mg/dL — ABNORMAL LOW (ref 8.9–10.3)
Chloride: 109 mmol/L (ref 98–111)
Creatinine, Ser: 0.87 mg/dL (ref 0.61–1.24)
GFR, Estimated: >60 mL/min (ref >60)
Glucose, Bld: 85 mg/dL (ref 70–99)
Potassium: 3.7 mmol/L (ref 3.5–5.1)
Sodium: 141 mmol/L (ref 135–145)
Total Bilirubin: 0.5 mg/dL (ref 0.3–1.2)
Total Protein: 5.6 g/dL — ABNORMAL LOW (ref 6.5–8.1)

## 2022-04-03 LAB — MAGNESIUM: Magnesium: 1.8 mg/dL (ref 1.7–2.4)

## 2022-04-03 LAB — PHOSPHORUS: Phosphorus: 3.4 mg/dL (ref 2.5–4.6)

## 2022-04-03 MED ORDER — ALPRAZOLAM 1 MG PO TABS
0.5000 mg | ORAL_TABLET | Freq: Two times a day (BID) | ORAL | 0 refills | Status: AC | PRN
Start: 2022-04-03 — End: ?

## 2022-04-03 MED ORDER — AMIODARONE HCL 200 MG PO TABS
200.0000 mg | ORAL_TABLET | Freq: Two times a day (BID) | ORAL | Status: AC
Start: 2022-04-03 — End: ?

## 2022-04-03 MED ORDER — MIDODRINE HCL 10 MG PO TABS
10.0000 mg | ORAL_TABLET | Freq: Three times a day (TID) | ORAL | 0 refills | Status: AC
Start: 2022-04-03 — End: ?

## 2022-04-03 MED ORDER — TRAMADOL HCL 50 MG PO TABS: 50.0000 mg | ORAL_TABLET | Freq: Two times a day (BID) | ORAL | 0 refills | Status: AC | PRN

## 2022-04-03 NOTE — Plan of Care (Signed)

## 2022-04-03 NOTE — TOC Progression Note (Signed)
Transition of Care [facility]) - Progression Note    Patient Details  Name: Ryan Rodgers MRN: [id number] Date of Birth: Feb 26, 1951  Transition of [facility] CM/SW Contact  Purcell Mouton, RN Phone Number: 04/03/2022, 10:39 AM  Clinical Narrative:     O2 was delivered to pt on 6/13 to his room from [employer].   Expected Discharge Plan: [place] Barriers to Discharge: No Barriers Identified  Expected Discharge Plan and Services Expected Discharge Plan: [place] Choice: [place] arrangements for the past 2 months: Single Family Home                 DME Arranged: Oxygen DME Agency: [employer] Date DME Agency Contacted: 04/02/22 Time DME Agency Contacted: [phone] Representative spoke with at DME Agency: Andee Poles             Social Determinants of Health ([place]) Interventions    Readmission Risk Interventions     No data to display

## 2022-04-03 NOTE — Evaluation (Signed)
Physical Therapy Evaluation Patient Details Name: Ryan Rodgers MRN: [id number] DOB: Jun 22, 1951 Today's Date: 04/03/2022  History of Present Illness  Pt is a  71yo male presenting to [facility] ED on 6/9 from home secondary to a fall. gemeralized weakness, and slurred speech.   Recent hospitalization for pneumonia/sepsis for syncope and v-tach, discharged on home O2.  PMH: CAD, CHF, back pain, HTN, s/p ICD, v-tach, substance abuse, COPD, chronic respiratory failure on home O2.   Clinical Impression  Pt presents with the problems listed above and functional impairments below. Educated pt about abdominal binder and TED hose and appropriate wearing schedule, pt verbalized understanding and demonstrated appropriate donning and doffing technique. Pt remains orthostatic despite wearing of TED hose and abdominal binder (see chart below) but continues to deny symptoms; continued session with close monitoring of both pt and vitals, VSS throughout. Pt independent for bed mobility, modified independent for transfers, and min guard for ambulation in hallway with RW ~224f. Educated pt on use of RW and appropriate pacing upon discharge, pt verbalized understanding. Pt has home pulse oximeter and reinforced importance of regular SpO2 checks. Provided basic HEP and demonstrations, pt verbalized understanding. Pt has met mobility goals for safe discharge with HHPT, we will sign off, should needs change please re-consult. Thank you for this referral.     04/03/22 1135  Orthostatic Lying   BP- Lying 134/79  Pulse- Lying 76  Orthostatic Sitting  BP- Sitting (!) 105/93  Pulse- Sitting 87  Orthostatic Standing at 0 minutes  BP- Standing at 0 minutes 114/64  Pulse- Standing at 0 minutes 80  Orthostatic Standing at 3 minutes  BP- Standing at 3 minutes 98/72  Pulse- Standing at 3 minutes 89        Recommendations for follow up therapy are one component of a multi-disciplinary discharge planning process, led by the attending  physician.  Recommendations may be updated based on patient status, additional functional criteria and insurance authorization.  Follow Up Recommendations Home health PT    Assistance Recommended at Discharge Set up Supervision/Assistance  Patient can return home with the following  A little help with walking and/or transfers;A little help with bathing/dressing/bathroom;Assist for transportation;Help with stairs or ramp for entrance    Equipment Recommendations None recommended by PT (Pt has recommended DME)  Recommendations for Other Services       Functional Status Assessment Patient has had a recent decline in their functional status and demonstrates the ability to make significant improvements in function in a reasonable and predictable amount of time.     Precautions / Restrictions Precautions Precautions: Fall Precaution Comments: monitor OH and O2, recent falls Required Braces or Orthoses: Other Brace Other Brace: Abdominal binder and TED hose Restrictions Weight Bearing Restrictions: No      Mobility  Bed Mobility Overal bed mobility: Independent                  Transfers Overall transfer level: Modified independent Equipment used: Rolling walker (2 wheels)               General transfer comment: Increased time, orthostatic vitals taken and were positive but pt reports no symptoms.    Ambulation/Gait Ambulation/Gait assistance: Min guard Gait Distance (Feet): 280 Feet Assistive device: Rolling walker (2 wheels) Gait Pattern/deviations: Step-to pattern, Knee flexed in stance - right, Narrow base of support Gait velocity: decreased     General Gait Details: Pt's SpO2 monitored throughout ambulation task while on 2LO2 via [place], ranged 90-94% with  several standing rest breaks (abdominal binder and TED hose in place). Ambulated 253f in hallway with RW, min guard, no physical assist required or overt LOB noted. Pt's right RLE flexed in stance (unable to  extend) with significant medial-lateral laxity during weightbearing, narrow BOS.  Stairs            Wheelchair Mobility    Modified Rankin (Stroke Patients Only)       Balance Overall balance assessment: Needs assistance Sitting-balance support: Feet supported, No upper extremity supported Sitting balance-Leahy Scale: Good     Standing balance support: No upper extremity supported, Bilateral upper extremity supported, During functional activity Standing balance-Leahy Scale: Fair Standing balance comment: Pt able to compelte static stance without UE support for orthostatic vitals (though pt did fidget with hands, unable to keep still). Pt reliant on BUE support on RW during ambulation.                             Pertinent Vitals/Pain Pain Assessment Pain Assessment: No/denies pain    Home Living Family/patient expects to be discharged to:: Private residence Living Arrangements: Alone Available Help at Discharge: Neighbor;Available PRN/intermittently Type of Home: Mobile home Home Access: Ramped entrance       Home Layout: One level Home Equipment: RConservation officer, nature(2 wheels);Cane - single point      Prior Function Prior Level of Function : Independent/Modified Independent;History of Falls (last six months) (Pt reports 10 falls in the last month.)             Mobility Comments: uses SPC at all times, uses RW when I feel "different/funny" ADLs Comments: ind     Hand Dominance        Extremity/Trunk Assessment   Upper Extremity Assessment Upper Extremity Assessment: Overall WFL for tasks assessed    Lower Extremity Assessment Lower Extremity Assessment: RLE deficits/detail;LLE deficits/detail RLE Deficits / Details: Functional ROM, gross strength 4+/5 RLE Sensation: WNL LLE Deficits / Details: Functional ROM, gross strength 4/5 LLE Sensation: WNL    Cervical / Trunk Assessment Cervical / Trunk Assessment: Normal  Communication    Communication: HOH  Cognition Arousal/Alertness: Awake/alert Behavior During Therapy: WFL for tasks assessed/performed Overall Cognitive Status: Within Functional Limits for tasks assessed                                          General Comments General comments (skin integrity, edema, etc.): Orthostatic vitals (see flowsheet), but pt asymptomatic.    Exercises     Assessment/Plan    PT Assessment All further PT needs can be met in the next venue of care  PT Problem List Decreased strength;Decreased range of motion;Decreased activity tolerance;Decreased balance;Decreased mobility;Decreased coordination;Decreased cognition;Cardiopulmonary status limiting activity       PT Treatment Interventions      PT Goals (Current goals can be found in the Care Plan section)  Acute Rehab PT Goals Patient Stated Goal: "To get back to moving" PT Goal Formulation: With patient Time For Goal Achievement: 04/17/22 Potential to Achieve Goals: Good    Frequency       Co-evaluation               AM-PAC PT "6 Clicks" Mobility  Outcome Measure Help needed turning from your back to your side while in a flat bed without using bedrails?: None Help needed  moving from lying on your back to sitting on the side of a flat bed without using bedrails?: None Help needed moving to and from a bed to a chair (including a wheelchair)?: A Little Help needed standing up from a chair using your arms (e.g., wheelchair or bedside chair)?: A Little Help needed to walk in hospital room?: A Little Help needed climbing 3-5 steps with a railing? : A Lot 6 Click Score: 19    End of Session Equipment Utilized During Treatment: Gait belt;Oxygen;Other (comment) (2LO2 via [place], ab binder, TED hose) Activity Tolerance: Patient tolerated treatment well;No increased pain Patient left: in bed;with call bell/phone within reach Nurse Communication: Mobility status;Other (comment) (SpO2, Orthostatic  vitals) PT Visit Diagnosis: Difficulty in walking, not elsewhere classified (R26.2);History of falling (Z91.81)    Time: 1129-1204 PT Time Calculation (min) (ACUTE ONLY): 35 min   Charges:   PT Evaluation $PT Eval Low Complexity: 1 Low PT Treatments $Gait Training: 8-22 mins       Coolidge Breeze, PT, DPT [place] [facility] Department Office: [phone] Pager: [phone]  Coolidge Breeze 04/03/2022, 12:36 PM

## 2022-04-03 NOTE — Discharge Summary (Signed)
Physician Discharge Summary  Ryan Rodgers [id number] DOB: August 22, 1951 DOA: 03/29/2022  PCP: Beckie Salts, MD  Admit date: 03/29/2022 Discharge date: 04/03/2022  Admitted From: home Discharge disposition: home   Recommendations for Outpatient Follow-Up:   Home O2 arranged Consider palliative care referral Orthostatic hypotension: TED hose and abdominal binder Home health RN/PT non-contrast chest CT at 3-6 months is recommended BMP 1 week- may need aldactone adjusted   Discharge Diagnosis:   Principal Problem:   Dehydration Active Problems:   ICD (implantable cardioverter-defibrillator) in place -  Medtronic pacemaker.  Model EVERA MRI XT DR [id number].  Serial number [id number]    Chronic airway obstruction (HCC)   Chronic systolic CHF (congestive heart failure) ([place]) - by echo 02-23-2022 LVEF 20-25%   Orthostatic hypotension   Chronic respiratory failure with hypoxia (HCC)   Hypokalemia   Drug-seeking behavior    Discharge Condition: Improved.  Diet recommendation: Low sodium, heart healthy.  Wound care: None.  Code status: Full.   History of Present Illness:   history of chronic systolic heart failure EF of 20 to 25%, status post ICD, chronic opiate and benzodiazepine use/abuse, chronic back pain, hypertension, reflux, coronary disease, history of ventricular tachycardia, COPD, chronic hypoxic respiratory failure on home oxygen who presented to the [place] ER.   Please note the patient receives ALL of his medical care at [facility] in [place].  He has never been to [facility] outpatient practice or in a [facility] hospital before.   He was last hospitalized December 23, 2021 at [facility] for syncope and ventricular tachycardia.  He was discharged on home oxygen.  Apparently he was not wearing it and his sister sent the machine back home.  During this hospitalization, patient noted to be orthostatic.  Patient has a history of  orthostasis.  He presented to the [place] ER today hypoxic.  Apparently ER cannot arrange for him to get oxygen.  They also declined to send the patient back to [facility] where he normally gets all of his medical care.   Patient transferred to [facility].   Reportedly EDP talked with the patient's sister.  Patient has been abusing his hydrocodone and Xanax.   Patient is sleeping and does not want to wake up to answer any of my questions   Hospital Course by Problem:     Dehydration -will need close monitoring and adjustment of diuretics -held entresto   Hypokalemia -replete   Chronic respiratory failure with hypoxia ([place]) According to his discharge summary he was sent home on home oxygen.  EDP got the history from the patient's sister that he was not using his oxygen and she sent the machine back to the respiratory company.  -re-ordered home O2   Orthostatic Hypotension -Chronic problem for him.  He was orthostatic during his last hospital admission at [facility].  He received some IV fluids there.  - increase Midodrine 10 mg TID -patient remains orthostatic: Improved but still slight orthostasis when moving from sitting to standing (per patient asymptomatic).  May have to accept some orthostatic hypotension. -TED hose/abdominal binder   Chronic systolic CHF ([place]) - by echo 02-23-2022 LVEF 20-25% -Actively followed by cardiology at [facility].   Brent (hold) - ACE inhibitor ("hold) - Amiodarone 200 mg BID -Mexiletine 150 mg BID -will need daily weights    ICD (implantable cardioverter-defibrillator) in place  -  Medtronic  pacemaker.  Model EVERA MRI XT DR [id number].  Serial number [id number]  -Follows with [facility] cardiology in [place].  Per [facility] records, patient has a Medtronic pacemaker.  Model EVERA MRI XT DR [id number].  Serial number [id number]   Drug abuse/EtOH abuse - 6/9 urine rapid  drug screen positive benzodiazepine, opiates, THC -6/10 EtOH<10    Medical Consultants:      Discharge Exam:   Vitals:   04/03/22 0908 04/03/22 0919  BP:    Pulse:    Resp:    Temp:    SpO2: 95% 97%   Vitals:   04/03/22 0514 04/03/22 0849 04/03/22 0908 04/03/22 0919  BP: (!) 123/95     Pulse: 77 76    Resp: 18     Temp: 98.6 F (37 C)     TempSrc: Oral     SpO2: 93% 93% 95% 97%  Weight:      Height:        General exam: Appears calm and comfortable.  Says he is not sleeping well   The results of significant diagnostics from this hospitalization (including imaging, microbiology, ancillary and laboratory) are listed below for reference.     Procedures and Diagnostic Studies:   CT Chest Wo Contrast  Result Date: 03/29/2022 CLINICAL DATA:  Shortness of breath. EXAM: CT CHEST WITHOUT CONTRAST TECHNIQUE: Multidetector CT imaging of the chest was performed following the standard protocol without IV contrast. RADIATION DOSE REDUCTION: This exam was performed according to the departmental dose-optimization program which includes automated exposure control, adjustment of the mA and/or kV according to patient size and/or use of iterative reconstruction technique. COMPARISON:  CT dated 12/17/2012 and chest radiograph dated 03/29/2022. FINDINGS: Evaluation of this exam is limited in the absence of intravenous contrast. Cardiovascular: There is no cardiomegaly or pericardial effusion. Advanced 3 vessel coronary vascular calcification. Mild atherosclerotic calcification of the thoracic aorta. No aneurysmal dilatation. The central pulmonary arteries are grossly unremarkable. Left sided pacemaker device. Mediastinum/Nodes: No hilar or mediastinal adenopathy. The esophagus and thyroid gland are grossly unremarkable. No mediastinal fluid collection. Lungs/Pleura: Severe centrilobular emphysema. Bilateral lower lobe linear scarring as well as areas of scarring in the left upper lobe. The left  upper lobe scarring as nodular components measuring up to 13 mm. No consolidative changes. There is no pleural effusion or pneumothorax. The central airways are patent. Upper Abdomen: Multiple gallstones.  A 3.5 cm left renal pole cyst. Musculoskeletal: No acute osseous pathology. IMPRESSION: 1. No acute intrathoracic pathology. 2. Left upper lobe nodular scarring. Non-contrast chest CT at 3-6 months is recommended. If the nodules are stable at time of repeat CT, then future CT at 18-24 months (from today's scan) is considered optional for low-risk patients, but is recommended for high-risk patients. This recommendation follows the consensus statement: Guidelines for Management of Incidental Pulmonary Nodules Detected on CT Images: From the Fleischner Society 2017; Radiology 2017; 284:228-243. 3. Cholelithiasis. 4. Aortic Atherosclerosis (ICD10-I70.0) and Emphysema (ICD10-J43.9). Electronically Signed   By: Anner Crete M.D.   On: 03/29/2022 22:56   DG Chest Port 1 View  Result Date: 03/29/2022 CLINICAL DATA:  Weakness, fall 3 days ago. EXAM: PORTABLE CHEST 1 VIEW COMPARISON:  02/07/2022. FINDINGS: Heart is enlarged the mediastinal contour is stable. The pulmonary vasculature is within normal limits. Atherosclerotic calcification of the aorta is noted. A multi lead pacemaker device is present over the left chest. Lung volumes are low with mild atelectasis or infiltrate at the lung bases. There are small  bilateral pleural effusions. No pneumothorax. No acute osseous abnormality. IMPRESSION: 1. Small pleural effusions with atelectasis at the lung bases. 2. Cardiomegaly. Electronically Signed   By: Brett Fairy M.D.   On: 03/29/2022 20:08   CT HEAD WO CONTRAST  Result Date: 03/29/2022 CLINICAL DATA:  Head trauma, abnormal mental status (Age 71-64y) EXAM: CT HEAD WITHOUT CONTRAST TECHNIQUE: Contiguous axial images were obtained from the base of the skull through the vertex without intravenous contrast.  RADIATION DOSE REDUCTION: This exam was performed according to the departmental dose-optimization program which includes automated exposure control, adjustment of the mA and/or kV according to patient size and/or use of iterative reconstruction technique. COMPARISON:  CT head 02/22/2022 BRAIN: BRAIN Trace patchy areas of decreased attenuation are noted throughout the deep and periventricular white matter of the cerebral hemispheres bilaterally, compatible with chronic microvascular ischemic disease. No evidence of large-territorial acute infarction. No parenchymal hemorrhage. No mass lesion. No extra-axial collection. No mass effect or midline shift. No hydrocephalus. Basilar cisterns are patent. Vascular: No hyperdense vessel. Atherosclerotic calcifications are present within the cavernous internal carotid arteries. Skull: No acute fracture or focal lesion. Sinuses/Orbits: Paranasal sinuses and mastoid air cells are clear. The orbits are unremarkable. Other: None. IMPRESSION: No acute intracranial abnormality. Electronically Signed   By: Iven Finn M.D.   On: 03/29/2022 19:48     Labs:   Basic Metabolic Panel: Recent Labs  Lab 03/30/22 0905 03/31/22 0356 04/01/22 0338 04/02/22 0351 04/03/22 0423  NA 142 146* 141 142 141  K 3.0* 3.6 4.2 3.7 3.7  CL 106 111 110 110 109  CO2 29 28 25 26 27   GLUCOSE 95 95 96 101* 85  BUN 14 15 12 17 16   CREATININE 1.13 1.06 0.91 0.85 0.87  CALCIUM 8.1* 8.6* 8.4* 8.9 8.6*  MG 1.8 2.0 1.9 1.8 1.8  PHOS 3.7 3.3 3.5 3.8 3.4   GFR Estimated Creatinine Clearance: 74.3 mL/min (by C-G formula based on SCr of 0.87 mg/dL). Liver Function Tests: Recent Labs  Lab 03/30/22 0905 03/31/22 0356 04/01/22 0338 04/02/22 0351 04/03/22 0423  AST 23 18 21 16 15   ALT 24 23 18 20 20   ALKPHOS 61 60 50 59 59  BILITOT 1.0 1.1 1.0 0.7 0.5  PROT 5.7* 5.6* 5.7* 5.6* 5.6*  ALBUMIN 3.0* 2.9* 3.3* 3.2* 3.1*   No results for input(s): "LIPASE", "AMYLASE" in the last 168  hours. Recent Labs  Lab 03/29/22 2018  AMMONIA 34   Coagulation profile Recent Labs  Lab 03/29/22 1910  INR 1.1    CBC: Recent Labs  Lab 03/29/22 1910 03/29/22 2027 03/30/22 0741 03/31/22 0356 04/01/22 0338 04/02/22 0351 04/03/22 0423  WBC 6.2  --  5.4 4.3 3.5* 3.6* 5.3  NEUTROABS 4.4  --   --  2.7 2.2 2.5 3.7  HGB 14.8   < > 14.3 12.9* 11.7* 12.3* 12.2*  HCT 46.4   < > 44.9 41.8 39.1 40.3 39.2  MCV 86.4  --  88.6 89.7 92.0 91.4 89.5  PLT 149*  --  137* 131* 130* 117* 125*   < > = values in this interval not displayed.   Cardiac Enzymes: No results for input(s): "CKTOTAL", "CKMB", "CKMBINDEX", "TROPONINI" in the last 168 hours. BNP: Invalid input(s): "POCBNP" CBG: Recent Labs  Lab 03/29/22 1901  GLUCAP 149*   D-Dimer No results for input(s): "DDIMER" in the last 72 hours. Hgb A1c No results for input(s): "HGBA1C" in the last 72 hours. Lipid Profile No results for input(s): "  CHOL", "HDL", "LDLCALC", "TRIG", "CHOLHDL", "LDLDIRECT" in the last 72 hours. Thyroid function studies No results for input(s): "TSH", "T4TOTAL", "T3FREE", "THYROIDAB" in the last 72 hours.  Invalid input(s): "FREET3" Anemia work up No results for input(s): "VITAMINB12", "FOLATE", "FERRITIN", "TIBC", "IRON", "RETICCTPCT" in the last 72 hours. Microbiology No results found for this or any previous visit (from the past 240 hour(s)).   Discharge Instructions:   Discharge Instructions     (HEART FAILURE PATIENTS) Call MD:  Anytime you have any of the following symptoms: 1) 3 pound weight gain in 24 hours or 5 pounds in 1 week 2) shortness of breath, with or without a dry hacking cough 3) swelling in the hands, feet or stomach 4) if you have to sleep on extra pillows at night in order to breathe.   Complete by: As directed    Diet - low sodium heart healthy   Complete by: As directed    Discharge instructions   Complete by: As directed    Wear TED hose and compression stockings when  out of bed Get up slowly If able sleep with head of bed at a 20 degree angle   Heart Failure patients record your daily weight using the same scale at the same time of day   Complete by: As directed    Increase activity slowly   Complete by: As directed       Allergies as of 04/03/2022       Reactions   Bee Venom    Other reaction(s): Other (See Comments) dont know what will do after heart attack   Buspirone    Other reaction(s): Other (See Comments) Muscle cramps. Muscle cramps.   Iodinated Contrast Media Rash   Nuclear med dye, sister states can take CT dye   Sulfa Antibiotics Rash   Patient reported not having a reaction to sulfa        Medication List     STOP taking these medications    Entresto 24-26 MG Generic drug: sacubitril-valsartan   HYDROcodone-acetaminophen 5-325 MG tablet Commonly known as: NORCO/VICODIN   potassium chloride SA 20 MEQ tablet Commonly known as: KLOR-CON M       TAKE these medications    ALPRAZolam 1 MG tablet Commonly known as: XANAX Take 0.5 tablets (0.5 mg total) by mouth 2 (two) times daily as needed for anxiety. What changed: how much to take   amiodarone 200 MG tablet Commonly known as: PACERONE Take 1 tablet (200 mg total) by mouth 2 (two) times daily.   aspirin EC 81 MG tablet Take 81 mg by mouth daily.   Breo Ellipta 100-25 MCG/ACT Aepb Generic drug: fluticasone furoate-vilanterol Inhale 1 puff into the lungs daily.   diclofenac Sodium 1 % Gel Commonly known as: VOLTAREN Apply 2 g topically daily as needed (for pain).   finasteride 5 MG tablet Commonly known as: PROSCAR Take 5 mg by mouth daily.   mexiletine 150 MG capsule Commonly known as: MEXITIL Take 150 mg by mouth 2 (two) times daily.   midodrine 10 MG tablet Commonly known as: PROAMATINE Take 1 tablet (10 mg total) by mouth 3 (three) times daily with meals.   omeprazole 40 MG capsule Commonly known as: PRILOSEC Take 40 mg by mouth daily.    ondansetron 4 MG disintegrating tablet Commonly known as: ZOFRAN-ODT Take by mouth.   rosuvastatin 20 MG tablet Commonly known as: CRESTOR Take 20 mg by mouth at bedtime.   spironolactone 25 MG tablet Commonly known as:  ALDACTONE Take 12.5 mg by mouth daily.   tamsulosin 0.4 MG Caps capsule Commonly known as: FLOMAX Take 0.8 mg by mouth daily.   traMADol 50 MG tablet Commonly known as: ULTRAM Take 1 tablet (50 mg total) by mouth every 12 (twelve) hours as needed for moderate pain or severe pain.   venlafaxine XR 150 MG 24 hr capsule Commonly known as: EFFEXOR-XR Take 150 mg by mouth daily.               Durable Medical Equipment  (From admission, onward)           Start     Ordered   03/31/22 1127  For home use only DME oxygen  Once       Comments: SATURATION QUALIFICATIONS: (This note is used to comply with regulatory documentation for home oxygen) Patient Saturations on Room Air at Rest = 90% Patient Saturations on Room Air while Ambulating = 85% Patient Saturations on 2 Liters of oxygen while Ambulating = 93% Please briefly explain why patient needs home oxygen: patient desats with exertion when off oxygen  -Patient meets criteria for home O2 - 2 L O2 via [place], titrate to maintain SPO2> 92% - Provide patient with Inogen home O2 concentrator  Question Answer Comment  Length of Need Lifetime   Mode or (Route) Nasal cannula   Liters per Minute 2   Oxygen conserving device Yes   Oxygen delivery system Gas      03/31/22 1127              Time coordinating discharge: 45 min  Signed:  Geradine Girt DO  [facility] 04/03/2022, 10:53 AM

## 2022-04-03 NOTE — Progress Notes (Signed)
Orthopedic Tech Progress Note Patient Details:  Ryan Rodgers 01-20-1951 [id number]  Patient ID: [name], male   DOB: April 01, 1951, 71 y.o.   MRN: [id number]  [name] 04/03/2022, 9:14 AM Abdominal binder delivered to room and instructed patient and NT about proper use when OOB

## 2022-12-27 IMAGING — CT CT HEAD W/O CM
3 series · 15 of 47 positions shown, 18 images · non-contrast
Comparison: CT head 02/22/2022

CLINICAL DATA: Head trauma, abnormal mental status (Age 18-64y)



[Series 2: head wo · axial · 0.41mm/px · z∈[-144,-14]mm · 9 of 32 slices shown, 12 images]
[im 3/32  brain]
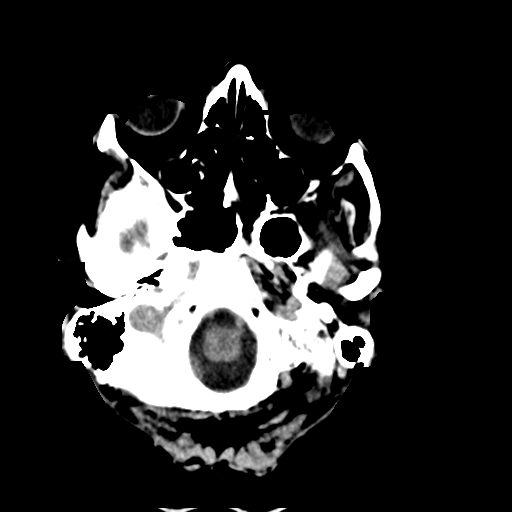
[im 3/32  bone]
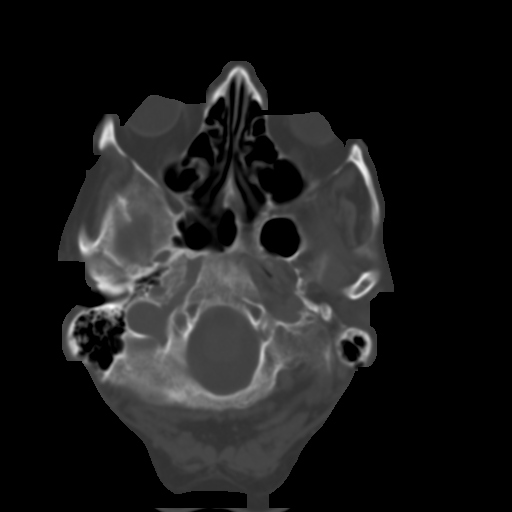
[im 6/32  brain]
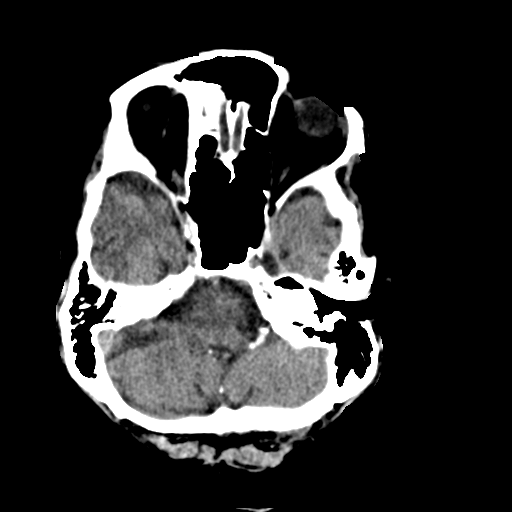
[im 9/32  brain]
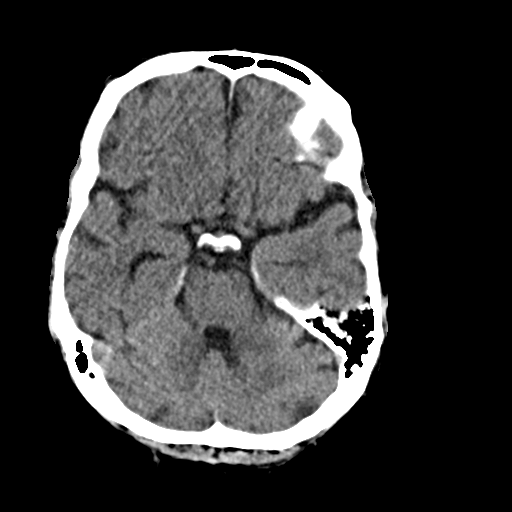
[im 12/32  brain]
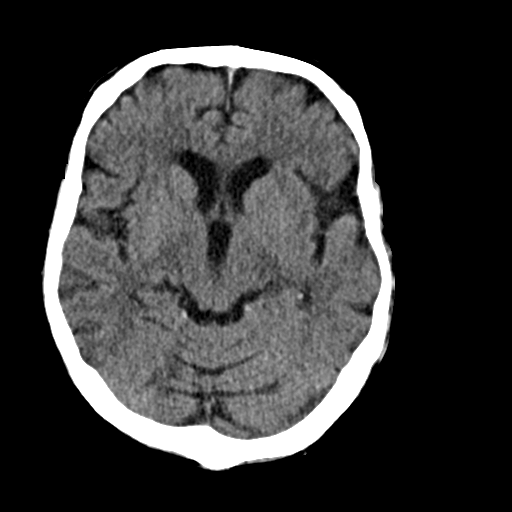
[im 17/32  brain]
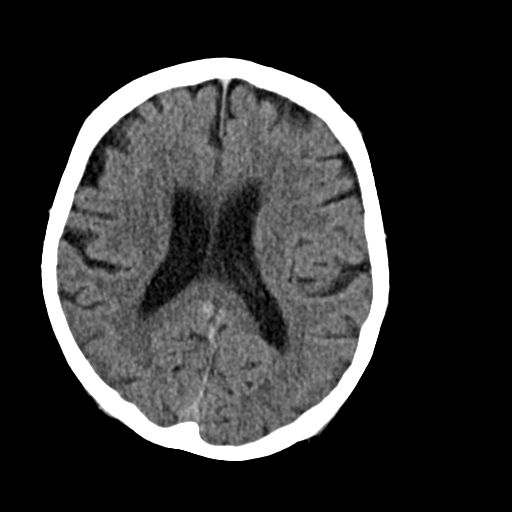
[im 17/32  bone]
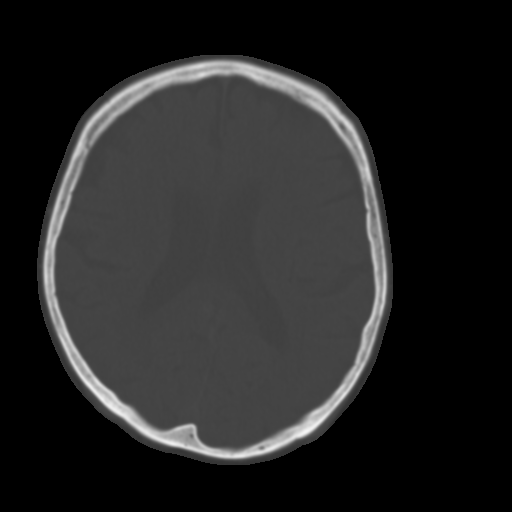
[im 20/32  brain]
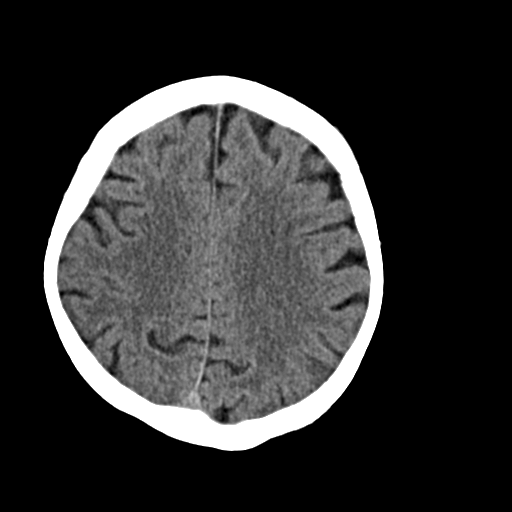
[im 23/32  brain]
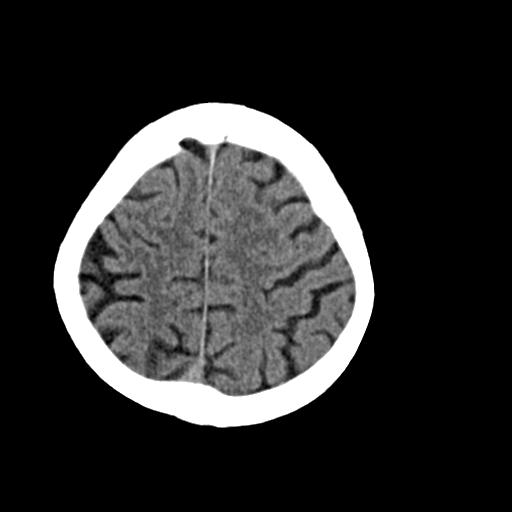
[im 26/32  brain]
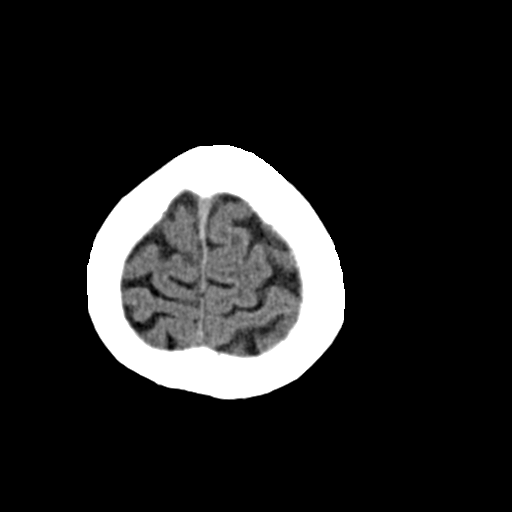
[im 29/32  brain]
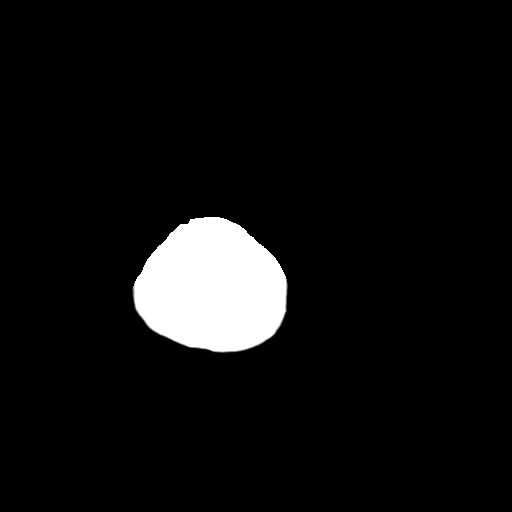
[im 29/32  bone]
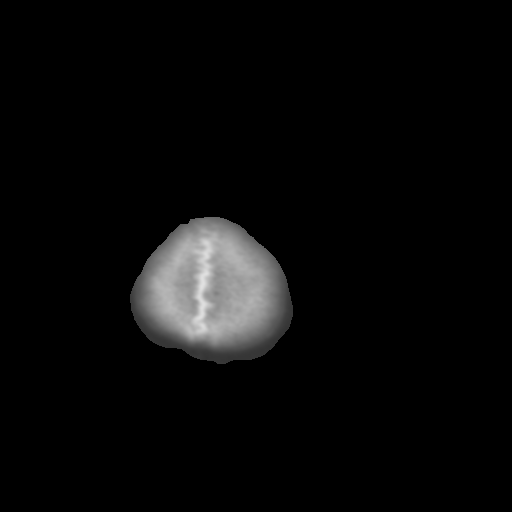

[Series 4: coronal soft · coronal · 0.31mm/px · 3 of 67 slices shown]
[im 23/67  brain]
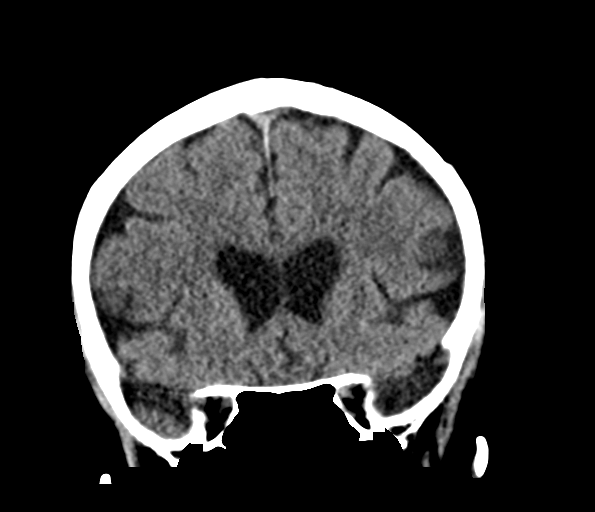
[im 30/67  brain]
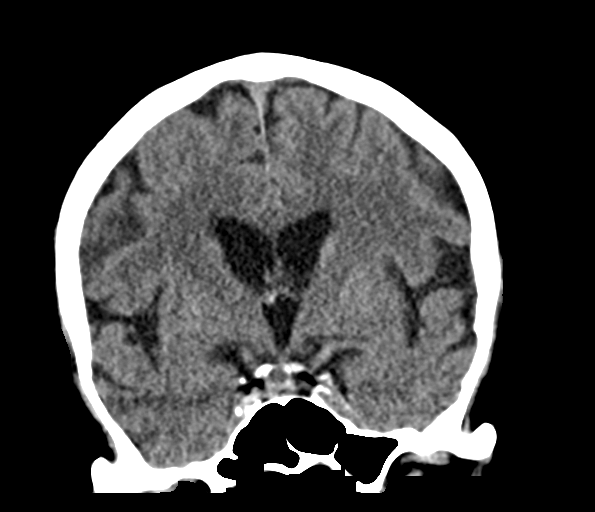
[im 37/67  brain]
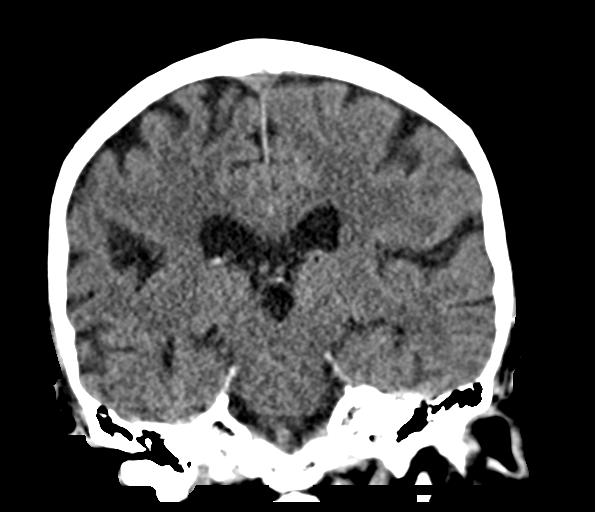

[Series 5: sag soft · sagittal · 0.32mm/px · 3 of 67 slices shown]
[im 23/67  brain]
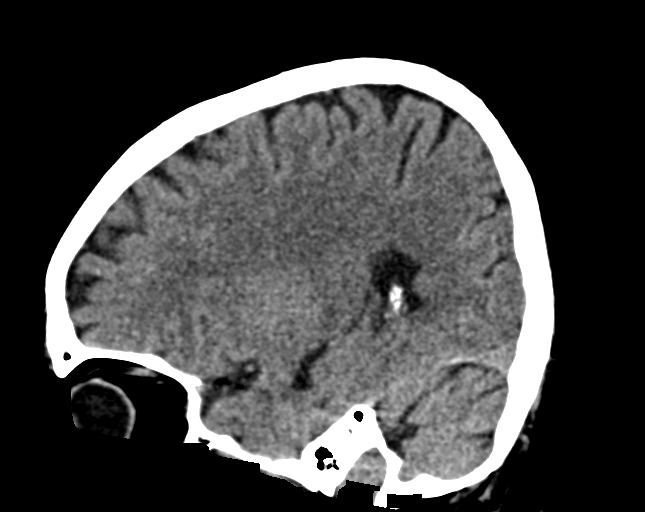
[im 34/67  brain]
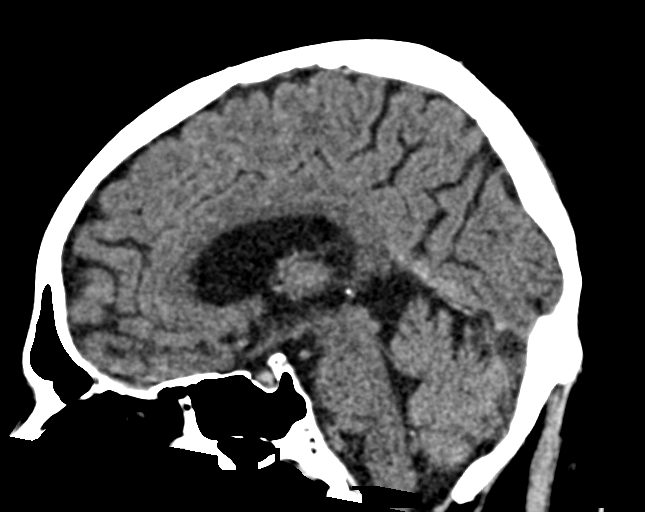
[im 45/67  brain]
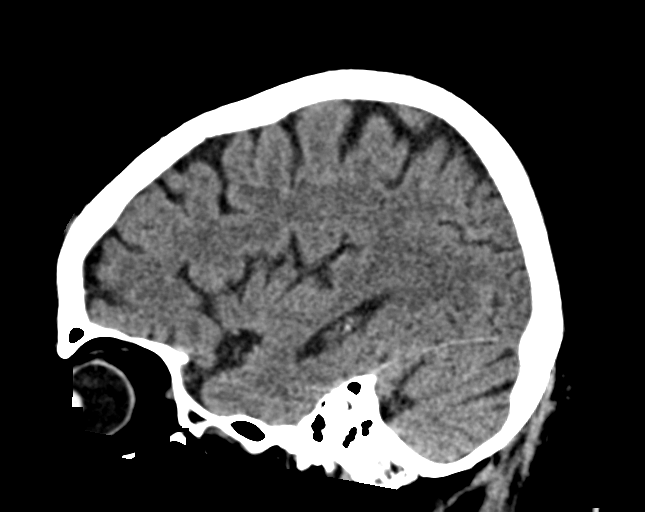

[15 of 47 positions shown; findings below may reference images not displayed]

BRAIN:
BRAIN
Trace patchy areas of decreased attenuation are noted throughout the
deep and periventricular white matter of the cerebral hemispheres
bilaterally, compatible with chronic microvascular ischemic disease.

No evidence of large-territorial acute infarction. No parenchymal
hemorrhage. No mass lesion. No extra-axial collection.

No mass effect or midline shift. No hydrocephalus. Basilar cisterns
are patent.

Vascular: No hyperdense vessel. Atherosclerotic calcifications are
present within the cavernous internal carotid arteries.

Skull: No acute fracture or focal lesion.

Sinuses/Orbits: Paranasal sinuses and mastoid air cells are clear.
The orbits are unremarkable.

Other: None.
IMPRESSION: No acute intracranial abnormality.
